# Patient Record
Sex: Female | Born: 1950 | Race: Black or African American | Hispanic: No | Marital: Married | Smoking: Former smoker
Health system: Southern US, Community
[De-identification: ages and names within clinical notes are randomized; demographics above are authoritative.]

## PROBLEM LIST (undated history)

## (undated) DIAGNOSIS — R7303 Prediabetes: Secondary | ICD-10-CM

## (undated) DIAGNOSIS — E785 Hyperlipidemia, unspecified: Secondary | ICD-10-CM

## (undated) DIAGNOSIS — I10 Essential (primary) hypertension: Secondary | ICD-10-CM

## (undated) DIAGNOSIS — I739 Peripheral vascular disease, unspecified: Secondary | ICD-10-CM

## (undated) HISTORY — DX: Essential (primary) hypertension: I10

## (undated) HISTORY — PX: EYE SURGERY: SHX253

## (undated) HISTORY — DX: Hyperlipidemia, unspecified: E78.5

---

## 2019-08-04 ENCOUNTER — Other Ambulatory Visit: Payer: Self-pay

## 2019-08-04 ENCOUNTER — Other Ambulatory Visit (HOSPITAL_COMMUNITY): Payer: Self-pay | Admitting: Family Medicine

## 2019-08-04 ENCOUNTER — Ambulatory Visit (HOSPITAL_COMMUNITY)
Admission: RE | Admit: 2019-08-04 | Discharge: 2019-08-04 | Disposition: A | Discharge status: Home / Self Care | Payer: Medicare Other | Source: Ambulatory Visit | Attending: Cardiovascular Disease | Admitting: Cardiovascular Disease

## 2019-08-04 DIAGNOSIS — I739 Peripheral vascular disease, unspecified: Secondary | ICD-10-CM | POA: Diagnosis present

## 2020-03-15 ENCOUNTER — Other Ambulatory Visit: Payer: Self-pay

## 2020-03-15 ENCOUNTER — Ambulatory Visit (INDEPENDENT_AMBULATORY_CARE_PROVIDER_SITE_OTHER): Payer: Medicare Other | Admitting: Cardiovascular Disease

## 2020-03-15 ENCOUNTER — Encounter: Payer: Self-pay | Admitting: Cardiovascular Disease

## 2020-03-15 VITALS — BP 156/78 | HR 64 | Ht 61.5 in | Wt 143.4 lb

## 2020-03-15 DIAGNOSIS — Z01818 Encounter for other preprocedural examination: Secondary | ICD-10-CM | POA: Diagnosis not present

## 2020-03-15 DIAGNOSIS — I739 Peripheral vascular disease, unspecified: Secondary | ICD-10-CM | POA: Diagnosis not present

## 2020-03-15 DIAGNOSIS — E785 Hyperlipidemia, unspecified: Secondary | ICD-10-CM | POA: Diagnosis not present

## 2020-03-15 LAB — BASIC METABOLIC PANEL
BUN/Creatinine Ratio: 16 (ref 12–28)
BUN: 12 mg/dL (ref 8–27)
CO2: 18 mmol/L — ABNORMAL LOW (ref 20–29)
Calcium: 9.5 mg/dL (ref 8.7–10.3)
Chloride: 107 mmol/L — ABNORMAL HIGH (ref 96–106)
Creatinine, Ser: 0.76 mg/dL (ref 0.57–1.00)
GFR calc Af Amer: 91 mL/min/{1.73_m2} (ref >59)
GFR calc non Af Amer: 79 mL/min/{1.73_m2} (ref >59)
Glucose: 96 mg/dL (ref 65–99)
Potassium: 4.4 mmol/L (ref 3.5–5.2)
Sodium: 140 mmol/L (ref 134–144)

## 2020-03-15 LAB — CBC
Hematocrit: 37.7 % (ref 34.0–46.6)
Hemoglobin: 13 g/dL (ref 11.1–15.9)
MCH: 31.2 pg (ref 26.6–33.0)
MCHC: 34.5 g/dL (ref 31.5–35.7)
MCV: 90 fL (ref 79–97)
Platelets: 254 10*3/uL (ref 150–450)
RBC: 4.17 x10E6/uL (ref 3.77–5.28)
RDW: 14.2 % (ref 11.7–15.4)
WBC: 11 10*3/uL — ABNORMAL HIGH (ref 3.4–10.8)

## 2020-03-15 MED ORDER — ROSUVASTATIN CALCIUM 20 MG PO TABS: 20.0000 mg | ORAL_TABLET | Freq: Every day | ORAL | 3 refills | Status: DC

## 2020-03-15 NOTE — Patient Instructions (Signed)
Medication Instructions:  START Rosuvastatin (Crestor) 20 mg once daily  *If you need a refill on your cardiac medications before your next appointment, please call your pharmacy*   Testing/Procedures: Your physician has requested that you have a peripheral vascular angiogram. This exam is performed at the hospital. During this exam IV contrast is used to look at arterial blood flow. Please review the information sheet given for details.  Follow-Up: At [facility], you and your health needs are our priority.  As part of our continuing mission to provide you with exceptional heart care, we have created designated Provider Care Teams.  These Care Teams include your primary Cardiologist (physician) and Advanced Practice Providers (APPs -  Physician Assistants and Nurse Practitioners) who all work together to provide you with the care you need, when you need it.  We recommend signing up for the patient portal called "MyChart".  Sign up information is provided on this After Visit Summary.  MyChart is used to connect with patients for Virtual Visits (Telemedicine).  Patients are able to view lab/test results, encounter notes, upcoming appointments, etc.  Non-urgent messages can be sent to your provider as well.   To learn more about what you can do with MyChart, go to [email or web address].    Your next appointment:   Keep your follow up with Dr. Kirke Corin on 04/12/20 at 8:40 am   Other Instructions    [facility] CARDIOVASCULAR DIVISION [facility] [address] [place] [address] Dept: [phone] Loc: [phone]  Christy Heath  03/15/2020  You are scheduled for a Peripheral Angiogram on Wednesday, November 17 with Dr. Lorine Bears.  1. Please arrive at the [facility] (Main Entrance A) at [facility]: [address] [place], [place] [address] at 6:30 AM (This time is two hours before your procedure to ensure your  preparation). Free valet parking service is available.   Special note: Every effort is made to have your procedure done on time. Please understand that emergencies sometimes delay scheduled procedures.  2. Diet: Do not eat solid foods after midnight.  The patient may have clear liquids until 5am upon the day of the procedure.  3. Labs: You will need to have blood drawn today: CBC and BMET  You will need to have the coronavirus test completed prior to your procedure. An appointment has been made at 12:05 pm on 03/19/20. This is a Drive Up Visit at [address] [place], [place], [place] [address]. Please tell them that you are there for procedure testing. Stay in your car and someone will be with you shortly. Please make sure to have all other labs completed before this test because you will need to stay quarantined until your procedure.   4. Medication instructions in preparation for your procedure: Hold the spironolactone the morning of the procedure   On the morning of your procedure, take your Aspirin and any morning medicines NOT listed above.  You may use sips of water.  5. Plan for one night stay--bring personal belongings. 6. Bring a current list of your medications and current insurance cards. 7. You MUST have a responsible person to drive you home. 8. Someone MUST be with you the first 24 hours after you arrive home or your discharge will be delayed. 9. Please wear clothes that are easy to get on and off and wear slip-on shoes.  Thank you for allowing [place] to care for you!   -- [facility]

## 2020-03-15 NOTE — Progress Notes (Signed)
Cardiology Office Note   Date:  03/15/2020   ID:  Christy Heath, DOB 1950/06/26, MRN [id number]  PCP:  Dois Davenport, MD  Cardiologist:   Lorine Bears, MD   No chief complaint on file.     History of Present Illness: Christy Heath is a 69 y.o. female who was referred by Dr. Hal Hope for evaluation management of peripheral arterial disease.  She has known history of hyperlipidemia, essential hypertension, COPD and previous tobacco use.  She quit smoking in March but she smoked 1 pack/day for more than 50 years.  She started having bilateral leg claudication earlier this year. She underwent lower extremity arterial Doppler in March of this year which showed moderately reduced ABI bilaterally at 0.6 on the right and 0.55 on the left.  She reports severe discomfort in both calfs worse on the left side.  This has progressed to the point where it happens 1 or 2 minutes after she starts walking and then she has to stop and rest for few minutes before she can resume.  In addition, she has bilateral buttock claudication.  She has no rest pain or lower extremity ulceration.  She tried going to the gym with her daughter but has not been able to do much aerobic activities due to severity of her symptoms.  She denies any chest pain.  She reports mild exertional dyspnea.  Past Medical History:  Diagnosis Date  . Hyperlipidemia   . Hypertension        Current Outpatient Medications  Medication Sig Dispense Refill  . aspirin EC 81 MG tablet Take 81 mg by mouth daily. Swallow whole.    . calcium-vitamin D (OSCAL WITH D) 500-200 MG-UNIT tablet Take 1 tablet by mouth. Pt takes 1 tablet once a week.    . loperamide (IMODIUM) 2 MG capsule Take 2 mg by mouth as needed for diarrhea or loose stools. Pt takes 2 tablets daily    . spironolactone (ALDACTONE) 25 MG tablet Take 25 mg by mouth daily.    Marland Kitchen tolterodine (DETROL) 2 MG tablet Take 2 mg by mouth daily.    . rosuvastatin (CRESTOR) 20 MG  tablet Take 1 tablet (20 mg total) by mouth daily. 90 tablet 3   No current facility-administered medications for this visit.    Allergies:   Patient has no known allergies.    Social History:  The patient  reports that she has quit smoking. Her smoking use included cigarettes. She has never used smokeless tobacco. She reports current alcohol use. She reports current drug use. Drug: Marijuana.   Family History:  The patient's family history is negative for peripheral arterial disease or coronary artery disease.   ROS:  Please see the history of present illness.   Otherwise, review of systems are positive for none.   All other systems are reviewed and negative.    PHYSICAL EXAM: VS:  BP (!) 156/78 (BP Location: Left Arm, Patient Position: Sitting)   Pulse 64   Ht 5' 1.5" (1.562 m)   Wt 143 lb 6.4 oz (65 kg)   SpO2 97%   BMI 26.66 kg/m  , BMI Body mass index is 26.66 kg/m. GEN: Well nourished, well developed, in no acute distress  HEENT: normal  Neck: no JVD, carotid bruits, or masses Cardiac: RRR; no  rubs, or gallops,no edema .  1 out of 6 systolic murmur in the aortic area Respiratory:  clear to auscultation bilaterally, normal work of breathing GI: soft, nontender, nondistended, +  BS MS: no deformity or atrophy  Skin: warm and dry, no rash Neuro:  Strength and sensation are intact Psych: euthymic mood, full affect Vascular: Radial pulses normal bilaterally.  Femoral pulses are +1 bilaterally but more diminished on the left side.  Distal pulses are not palpable   EKG:  EKG is ordered today. The ekg ordered today demonstrates normal sinus rhythm with possible left atrial enlargement.  No significant ST or T wave changes.   Recent Labs: No results found for requested labs within last 8760 hours.    Lipid Panel No results found for: CHOL, TRIG, HDL, CHOLHDL, VLDL, LDLCALC, LDLDIRECT    Wt Readings from Last 3 Encounters:  03/15/20 143 lb 6.4 oz (65 kg)        No  flowsheet data found.    ASSESSMENT AND PLAN:  1.  Peripheral arterial disease with severe bilateral leg claudication worse on the left side.  The patient has mostly calf claudication but also complains of buttock claudication.  Her exam is suggestive of both iliac and SFA/popliteal disease.  I discussed with her the natural history and management of peripheral arterial disease.  I discussed with her the option of starting a walking exercise program.  However, she reports severe limitations and she has not been able to advance her activities in spite of going to the gym.  Given severity of her symptoms, I recommend proceeding with abdominal aortogram with lower extremity runoff and possible endovascular intervention.  I discussed the procedure in details as well as risk and benefits. Planned access is via the right common femoral artery.  Continue aspirin 81 mg once daily.  2.  Hyperlipidemia: I started rosuvastatin 20 mg daily given diagnosis of peripheral arterial disease.  3.  Previous tobacco use: She quit smoking in March.    Disposition:   FU with me in 1 month  Signed,  Damascus Feldpausch, MD  03/15/2020 1:30 PM    [facility] 

## 2020-03-15 NOTE — H&P (View-Only) (Signed)
Cardiology Office Note   Date:  03/15/2020   ID:  Christy Heath, DOB 1950/06/26, MRN [id number]  PCP:  Dois Davenport, MD  Cardiologist:   Lorine Bears, MD   No chief complaint on file.     History of Present Illness: Christy Heath is a 69 y.o. female who was referred by Dr. Hal Hope for evaluation management of peripheral arterial disease.  She has known history of hyperlipidemia, essential hypertension, COPD and previous tobacco use.  She quit smoking in March but she smoked 1 pack/day for more than 50 years.  She started having bilateral leg claudication earlier this year. She underwent lower extremity arterial Doppler in March of this year which showed moderately reduced ABI bilaterally at 0.6 on the right and 0.55 on the left.  She reports severe discomfort in both calfs worse on the left side.  This has progressed to the point where it happens 1 or 2 minutes after she starts walking and then she has to stop and rest for few minutes before she can resume.  In addition, she has bilateral buttock claudication.  She has no rest pain or lower extremity ulceration.  She tried going to the gym with her daughter but has not been able to do much aerobic activities due to severity of her symptoms.  She denies any chest pain.  She reports mild exertional dyspnea.  Past Medical History:  Diagnosis Date  . Hyperlipidemia   . Hypertension        Current Outpatient Medications  Medication Sig Dispense Refill  . aspirin EC 81 MG tablet Take 81 mg by mouth daily. Swallow whole.    . calcium-vitamin D (OSCAL WITH D) 500-200 MG-UNIT tablet Take 1 tablet by mouth. Pt takes 1 tablet once a week.    . loperamide (IMODIUM) 2 MG capsule Take 2 mg by mouth as needed for diarrhea or loose stools. Pt takes 2 tablets daily    . spironolactone (ALDACTONE) 25 MG tablet Take 25 mg by mouth daily.    Marland Kitchen tolterodine (DETROL) 2 MG tablet Take 2 mg by mouth daily.    . rosuvastatin (CRESTOR) 20 MG  tablet Take 1 tablet (20 mg total) by mouth daily. 90 tablet 3   No current facility-administered medications for this visit.    Allergies:   Patient has no known allergies.    Social History:  The patient  reports that she has quit smoking. Her smoking use included cigarettes. She has never used smokeless tobacco. She reports current alcohol use. She reports current drug use. Drug: Marijuana.   Family History:  The patient's family history is negative for peripheral arterial disease or coronary artery disease.   ROS:  Please see the history of present illness.   Otherwise, review of systems are positive for none.   All other systems are reviewed and negative.    PHYSICAL EXAM: VS:  BP (!) 156/78 (BP Location: Left Arm, Patient Position: Sitting)   Pulse 64   Ht 5' 1.5" (1.562 m)   Wt 143 lb 6.4 oz (65 kg)   SpO2 97%   BMI 26.66 kg/m  , BMI Body mass index is 26.66 kg/m. GEN: Well nourished, well developed, in no acute distress  HEENT: normal  Neck: no JVD, carotid bruits, or masses Cardiac: RRR; no  rubs, or gallops,no edema .  1 out of 6 systolic murmur in the aortic area Respiratory:  clear to auscultation bilaterally, normal work of breathing GI: soft, nontender, nondistended, +  BS MS: no deformity or atrophy  Skin: warm and dry, no rash Neuro:  Strength and sensation are intact Psych: euthymic mood, full affect Vascular: Radial pulses normal bilaterally.  Femoral pulses are +1 bilaterally but more diminished on the left side.  Distal pulses are not palpable   EKG:  EKG is ordered today. The ekg ordered today demonstrates normal sinus rhythm with possible left atrial enlargement.  No significant ST or T wave changes.   Recent Labs: No results found for requested labs within last 8760 hours.    Lipid Panel No results found for: CHOL, TRIG, HDL, CHOLHDL, VLDL, LDLCALC, LDLDIRECT    Wt Readings from Last 3 Encounters:  03/15/20 143 lb 6.4 oz (65 kg)        No  flowsheet data found.    ASSESSMENT AND PLAN:  1.  Peripheral arterial disease with severe bilateral leg claudication worse on the left side.  The patient has mostly calf claudication but also complains of buttock claudication.  Her exam is suggestive of both iliac and SFA/popliteal disease.  I discussed with her the natural history and management of peripheral arterial disease.  I discussed with her the option of starting a walking exercise program.  However, she reports severe limitations and she has not been able to advance her activities in spite of going to the gym.  Given severity of her symptoms, I recommend proceeding with abdominal aortogram with lower extremity runoff and possible endovascular intervention.  I discussed the procedure in details as well as risk and benefits. Planned access is via the right common femoral artery.  Continue aspirin 81 mg once daily.  2.  Hyperlipidemia: I started rosuvastatin 20 mg daily given diagnosis of peripheral arterial disease.  3.  Previous tobacco use: She quit smoking in March.    Disposition:   FU with me in 1 month  Signed,  Lorine Bears, MD  03/15/2020 1:30 PM    [facility]

## 2020-03-19 ENCOUNTER — Other Ambulatory Visit (HOSPITAL_COMMUNITY)
Admission: RE | Admit: 2020-03-19 | Discharge: 2020-03-19 | Disposition: A | Discharge status: Home / Self Care | Payer: Medicare Other | Source: Ambulatory Visit | Attending: Cardiovascular Disease | Admitting: Cardiovascular Disease

## 2020-03-19 DIAGNOSIS — Z01812 Encounter for preprocedural laboratory examination: Secondary | ICD-10-CM | POA: Diagnosis present

## 2020-03-19 DIAGNOSIS — Z20822 Contact with and (suspected) exposure to covid-19: Secondary | ICD-10-CM | POA: Insufficient documentation

## 2020-03-19 LAB — SARS CORONAVIRUS 2 (TAT 6-24 HRS): SARS Coronavirus 2: NEGATIVE

## 2020-03-22 ENCOUNTER — Telehealth: Payer: Self-pay | Admitting: *Deleted

## 2020-03-22 NOTE — Telephone Encounter (Signed)
Pt contacted pre-catheterization scheduled at [facility] for: Wednesday March 23, 2020 8:30 AM Verified arrival time and place: [facility] Main Entrance A [facility]) at: 6:30 AM   No solid food after midnight prior to cath, clear liquids until 5 AM day of procedure.  Hold: Spironolactone -AM of procedure   Except hold medications AM meds can be  taken pre-cath with sips of water including: ASA 81 mg   Confirmed patient has responsible adult to drive home post procedure and be with patient first 24 hours after arriving home: yes  You are allowed ONE visitor in the waiting room during the time you are at the hospital for your procedure. Both you and your visitor must wear a mask once you enter the hospital.       COVID-19 Pre-Screening Questions:  . In the past 14 days have you had a new cough, new headache, new nasal congestion, fever (100.4 or greater) unexplained body aches, new sore throat, or sudden loss of taste or sense of smell? no . In the past 14 days have you been around anyone with known Covid 19? no    Reviewed procedure/mask/visitor instructions, COVID-19 questions with patient.

## 2020-03-23 ENCOUNTER — Encounter (HOSPITAL_COMMUNITY): Payer: Self-pay | Admitting: Cardiovascular Disease

## 2020-03-23 ENCOUNTER — Encounter (HOSPITAL_COMMUNITY)
Admission: RE | Disposition: A | Discharge status: Home / Self Care | Payer: Self-pay | Source: Home / Self Care | Attending: Cardiovascular Disease

## 2020-03-23 ENCOUNTER — Ambulatory Visit (HOSPITAL_COMMUNITY)
Admission: RE | Admit: 2020-03-23 | Discharge: 2020-03-23 | Disposition: A | Discharge status: Home / Self Care | Payer: Medicare Other | Attending: Cardiovascular Disease | Admitting: Cardiovascular Disease

## 2020-03-23 DIAGNOSIS — Z7982 Long term (current) use of aspirin: Secondary | ICD-10-CM | POA: Diagnosis not present

## 2020-03-23 DIAGNOSIS — I70212 Atherosclerosis of native arteries of extremities with intermittent claudication, left leg: Secondary | ICD-10-CM

## 2020-03-23 DIAGNOSIS — E785 Hyperlipidemia, unspecified: Secondary | ICD-10-CM | POA: Insufficient documentation

## 2020-03-23 DIAGNOSIS — I10 Essential (primary) hypertension: Secondary | ICD-10-CM | POA: Insufficient documentation

## 2020-03-23 DIAGNOSIS — J449 Chronic obstructive pulmonary disease, unspecified: Secondary | ICD-10-CM | POA: Diagnosis not present

## 2020-03-23 DIAGNOSIS — Z87891 Personal history of nicotine dependence: Secondary | ICD-10-CM | POA: Insufficient documentation

## 2020-03-23 DIAGNOSIS — Z79899 Other long term (current) drug therapy: Secondary | ICD-10-CM | POA: Insufficient documentation

## 2020-03-23 DIAGNOSIS — I739 Peripheral vascular disease, unspecified: Secondary | ICD-10-CM

## 2020-03-23 DIAGNOSIS — I714 Abdominal aortic aneurysm, without rupture: Secondary | ICD-10-CM | POA: Insufficient documentation

## 2020-03-23 DIAGNOSIS — I70213 Atherosclerosis of native arteries of extremities with intermittent claudication, bilateral legs: Secondary | ICD-10-CM | POA: Diagnosis not present

## 2020-03-23 HISTORY — PX: ABDOMINAL AORTOGRAM W/LOWER EXTREMITY: CATH118223

## 2020-03-23 HISTORY — PX: PERIPHERAL VASCULAR BALLOON ANGIOPLASTY: CATH118281

## 2020-03-23 HISTORY — PX: PERIPHERAL VASCULAR ATHERECTOMY: CATH118256

## 2020-03-23 LAB — POCT ACTIVATED CLOTTING TIME: Activated Clotting Time: 257 seconds

## 2020-03-23 SURGERY — ABDOMINAL AORTOGRAM W/LOWER EXTREMITY
Anesthesia: LOCAL

## 2020-03-23 MED ORDER — IODIXANOL 320 MG/ML IV SOLN
INTRAVENOUS | Status: DC | PRN
  Administered 2020-03-23: 195 mL

## 2020-03-23 MED ORDER — SODIUM CHLORIDE 0.9% FLUSH: 3.0000 mL | Freq: Two times a day (BID) | INTRAVENOUS | Status: DC

## 2020-03-23 MED ORDER — LIDOCAINE HCL (PF) 1 % IJ SOLN
INTRAMUSCULAR | Status: DC | PRN
  Administered 2020-03-23: 30 mL

## 2020-03-23 MED ORDER — VIPERSLIDE LUBRICANT OPTIME: TOPICAL | Status: DC | PRN

## 2020-03-23 MED ORDER — FENTANYL CITRATE (PF) 100 MCG/2ML IJ SOLN
INTRAMUSCULAR | Status: DC | PRN
  Administered 2020-03-23: 25 ug via INTRAVENOUS
  Administered 2020-03-23: 50 ug via INTRAVENOUS
  Administered 2020-03-23: 25 ug via INTRAVENOUS

## 2020-03-23 MED ORDER — NITROGLYCERIN IN D5W 200-5 MCG/ML-% IV SOLN
INTRAVENOUS | Status: AC
  Filled 2020-03-23: qty 250

## 2020-03-23 MED ORDER — ONDANSETRON HCL 4 MG/2ML IJ SOLN: 4.0000 mg | Freq: Four times a day (QID) | INTRAMUSCULAR | Status: DC | PRN

## 2020-03-23 MED ORDER — MIDAZOLAM HCL 2 MG/2ML IJ SOLN
INTRAMUSCULAR | Status: AC
  Filled 2020-03-23: qty 2

## 2020-03-23 MED ORDER — HEPARIN (PORCINE) IN NACL 1000-0.9 UT/500ML-% IV SOLN
INTRAVENOUS | Status: AC
  Filled 2020-03-23: qty 1000

## 2020-03-23 MED ORDER — ASPIRIN 81 MG PO CHEW: 81.0000 mg | CHEWABLE_TABLET | ORAL | Status: DC

## 2020-03-23 MED ORDER — CLOPIDOGREL BISULFATE 75 MG PO TABS: 75.0000 mg | ORAL_TABLET | Freq: Every day | ORAL | 6 refills | Status: DC

## 2020-03-23 MED ORDER — SODIUM CHLORIDE 0.9 % WEIGHT BASED INFUSION: 1.0000 mL/kg/h | INTRAVENOUS | Status: DC

## 2020-03-23 MED ORDER — ACETAMINOPHEN 325 MG PO TABS: 650.0000 mg | ORAL_TABLET | ORAL | Status: DC | PRN

## 2020-03-23 MED ORDER — LIDOCAINE HCL (PF) 1 % IJ SOLN
INTRAMUSCULAR | Status: AC
  Filled 2020-03-23: qty 30

## 2020-03-23 MED ORDER — CLOPIDOGREL BISULFATE 300 MG PO TABS
ORAL_TABLET | ORAL | Status: DC | PRN
  Administered 2020-03-23: 300 mg via ORAL

## 2020-03-23 MED ORDER — VERAPAMIL HCL 2.5 MG/ML IV SOLN
INTRAVENOUS | Status: AC
  Filled 2020-03-23: qty 2

## 2020-03-23 MED ORDER — SODIUM CHLORIDE 0.9% FLUSH: 3.0000 mL | INTRAVENOUS | Status: DC | PRN

## 2020-03-23 MED ORDER — HEPARIN (PORCINE) IN NACL 1000-0.9 UT/500ML-% IV SOLN
INTRAVENOUS | Status: DC | PRN
  Administered 2020-03-23: 500 mL

## 2020-03-23 MED ORDER — SODIUM CHLORIDE 0.9 % IV SOLN: 250.0000 mL | INTRAVENOUS | Status: DC | PRN

## 2020-03-23 MED ORDER — SODIUM CHLORIDE 0.9 % WEIGHT BASED INFUSION
3.0000 mL/kg/h | INTRAVENOUS | Status: AC
  Administered 2020-03-23: 3 mL/kg/h via INTRAVENOUS

## 2020-03-23 MED ORDER — HEPARIN SODIUM (PORCINE) 1000 UNIT/ML IJ SOLN
INTRAMUSCULAR | Status: DC | PRN
  Administered 2020-03-23: 7000 [IU] via INTRAVENOUS
  Administered 2020-03-23: 2000 [IU] via INTRAVENOUS

## 2020-03-23 MED ORDER — MIDAZOLAM HCL 2 MG/2ML IJ SOLN
INTRAMUSCULAR | Status: DC | PRN
  Administered 2020-03-23 (×3): 1 mg via INTRAVENOUS

## 2020-03-23 MED ORDER — SODIUM CHLORIDE 0.9 % IV SOLN: INTRAVENOUS | Status: DC

## 2020-03-23 MED ORDER — FENTANYL CITRATE (PF) 100 MCG/2ML IJ SOLN
INTRAMUSCULAR | Status: AC
  Filled 2020-03-23: qty 2

## 2020-03-23 MED ORDER — CLOPIDOGREL BISULFATE 300 MG PO TABS
ORAL_TABLET | ORAL | Status: AC
  Filled 2020-03-23: qty 1

## 2020-03-23 MED ORDER — HEPARIN SODIUM (PORCINE) 1000 UNIT/ML IJ SOLN
INTRAMUSCULAR | Status: AC
  Filled 2020-03-23: qty 1

## 2020-03-23 SURGICAL SUPPLY — 36 items
BALLN JADE .018 4.0 X 40 (BALLOONS) ×3
BALLN LUTONIX AV 8X40X75 (BALLOONS) ×3
BALLN MUSTANG 6.0X20 75 (BALLOONS) ×3
BALLOON JADE .018 4.0 X 40 (BALLOONS) ×2 IMPLANT
BALLOON LUTONIX AV 8X40X75 (BALLOONS) ×2 IMPLANT
BALLOON MUSTANG 6.0X20 75 (BALLOONS) ×2 IMPLANT
CATH ANGIO 5F PIGTAIL 65CM (CATHETERS) ×3 IMPLANT
CATH CROSS OVER TEMPO 5F (CATHETERS) ×3 IMPLANT
CATH CXI 2.6F 90 ANG (CATHETERS) ×1
CATH SPRT ANG 90X2.3FR ACPT (CATHETERS) ×2 IMPLANT
DCB RANGER 4.0X40 135 (BALLOONS) ×2 IMPLANT
DCB RANGER 4.0X60 135 (BALLOONS) ×2 IMPLANT
DEVICE CLOSURE MYNXGRIP 6/7F (Vascular Products) ×3 IMPLANT
DIAMONDBACK SOLID OAS 1.5MM (CATHETERS) ×3
KIT ENCORE 26 ADVANTAGE (KITS) ×3 IMPLANT
KIT MICROPUNCTURE NIT STIFF (SHEATH) ×3 IMPLANT
KIT PV (KITS) ×3 IMPLANT
LUBRICANT VIPERSLIDE CORONARY (MISCELLANEOUS) ×3 IMPLANT
RANGER DCB 4.0X40 135 (BALLOONS) ×3
RANGER DCB 4.0X60 135 (BALLOONS) ×3
SHEATH PINNACLE 5F 10CM (SHEATH) ×3 IMPLANT
SHEATH PINNACLE 6F 10CM (SHEATH) ×3 IMPLANT
SHEATH PINNACLE ST 6F 45CM (SHEATH) ×3 IMPLANT
SHEATH PROBE COVER 6X72 (BAG) ×3 IMPLANT
STOPCOCK MORSE 400PSI 3WAY (MISCELLANEOUS) ×3 IMPLANT
SYR MEDRAD MARK 7 150ML (SYRINGE) ×3 IMPLANT
SYSTEM DIMNDBCK SLD OAS 1.5MM (CATHETERS) ×2 IMPLANT
TAPE VIPERTRACK RADIOPAQ (MISCELLANEOUS) ×2 IMPLANT
TAPE VIPERTRACK RADIOPAQUE (MISCELLANEOUS) ×1
TRANSDUCER W/STOPCOCK (MISCELLANEOUS) ×3 IMPLANT
TRAY PV CATH (CUSTOM PROCEDURE TRAY) ×3 IMPLANT
TUBING CIL FLEX 10 FLL-RA (TUBING) ×3 IMPLANT
WIRE BENTSON .035X145CM (WIRE) ×3 IMPLANT
WIRE ROSEN-J .035X180CM (WIRE) ×3 IMPLANT
WIRE RUNTHROUGH .014X180CM (WIRE) ×3 IMPLANT
WIRE VIPER WIRECTO 0.014 (WIRE) ×6 IMPLANT

## 2020-03-23 NOTE — Progress Notes (Signed)
Christy Heath in to assess pt. And informed her of Bp readings. No new orders. States she can be d/c if no changes. Pt daughter called and informed.Ambulated pt in hallway tol well no bleeding noted before or after ambulation.

## 2020-03-23 NOTE — Discharge Instructions (Signed)
Femoral Site Care This sheet gives you information about how to care for yourself after your procedure. Your health care provider may also give you more specific instructions. If you have problems or questions, contact your health care provider. What can I expect after the procedure? After the procedure, it is common to have:  Bruising that usually fades within 1-2 weeks.  Tenderness at the site. Follow these instructions at home: Wound care  Follow instructions from your health care provider about how to take care of your insertion site. Make sure you: ? Wash your hands with soap and water before you change your bandage (dressing). If soap and water are not available, use hand sanitizer. ? Change your dressing as told by your health care provider. ? Leave stitches (sutures), skin glue, or adhesive strips in place. These skin closures may need to stay in place for 2 weeks or longer. If adhesive strip edges start to loosen and curl up, you may trim the loose edges. Do not remove adhesive strips completely unless your health care provider tells you to do that.  Do not take baths, swim, or use a hot tub until your health care provider approves.  You may shower 24-48 hours after the procedure or as told by your health care provider. ? Gently wash the site with plain soap and water. ? Pat the area dry with a clean towel. ? Do not rub the site. This may cause bleeding.  Do not apply powder or lotion to the site. Keep the site clean and dry.  Check your femoral site every day for signs of infection. Check for: ? Redness, swelling, or pain. ? Fluid or blood. ? Warmth. ? Pus or a bad smell. Activity  For the first 2-3 days after your procedure, or as long as directed: ? Avoid climbing stairs as much as possible. ? Do not squat.  Do not lift anything that is heavier than 10 lb (4.5 kg), or the limit that you are told, until your health care provider says that it is safe.  Rest as  directed. ? Avoid sitting for a long time without moving. Get up to take short walks every 1-2 hours.  Do not drive for 24 hours if you were given a medicine to help you relax (sedative). General instructions  Take over-the-counter and prescription medicines only as told by your health care provider.  Keep all follow-up visits as told by your health care provider. This is important. Contact a health care provider if you have:  A fever or chills.  You have redness, swelling, or pain around your insertion site. Get help right away if:  The catheter insertion area swells very fast.  You pass out.  You suddenly start to sweat or your skin gets clammy.  The catheter insertion area is bleeding, and the bleeding does not stop when you hold steady pressure on the area.  The area near or just beyond the catheter insertion site becomes pale, cool, tingly, or numb. These symptoms may represent a serious problem that is an emergency. Do not wait to see if the symptoms will go away. Get medical help right away. Call your local emergency services (911 in the [place] Do not drive yourself to the hospital. Summary  After the procedure, it is common to have bruising that usually fades within 1-2 weeks.  Check your femoral site every day for signs of infection.  Do not lift anything that is heavier than 10 lb (4.5 kg), or the   limit that you are told, until your health care provider says that it is safe. This information is not intended to replace advice given to you by your health care provider. Make sure you discuss any questions you have with your health care provider. Document Revised: 05/06/2017 Document Reviewed: 05/06/2017 Elsevier Patient Education  2020 Elsevier Inc.  

## 2020-03-23 NOTE — Progress Notes (Addendum)
Pt c/o pain to right buttocks  Lillia Abed, NP called and informed. States she will come by and check her

## 2020-03-23 NOTE — Interval H&P Note (Signed)
History and Physical Interval Note:  03/23/2020 8:26 AM  Christy Heath  has presented today for surgery, with the diagnosis of pad.  The various methods of treatment have been discussed with the patient and family. After consideration of risks, benefits and other options for treatment, the patient has consented to  Procedure(s): ABDOMINAL AORTOGRAM W/LOWER EXTREMITY (N/A) as a surgical intervention.  The patient's history has been reviewed, patient examined, no change in status, stable for surgery.  I have reviewed the patient's chart and labs.  Questions were answered to the patient's satisfaction.     Lorine Bears

## 2020-03-23 NOTE — Progress Notes (Signed)
Discharge instructions with pt and her daughter (via telephone) both voice understanding.  

## 2020-03-23 NOTE — Progress Notes (Signed)
Right leg is mottled in color. During report was told Dr Aware of skin color.

## 2020-03-24 ENCOUNTER — Other Ambulatory Visit: Payer: Self-pay | Admitting: *Deleted

## 2020-03-24 DIAGNOSIS — I739 Peripheral vascular disease, unspecified: Secondary | ICD-10-CM

## 2020-03-25 ENCOUNTER — Other Ambulatory Visit (HOSPITAL_COMMUNITY): Payer: Self-pay | Admitting: Cardiovascular Disease

## 2020-03-25 DIAGNOSIS — I739 Peripheral vascular disease, unspecified: Secondary | ICD-10-CM

## 2020-04-07 ENCOUNTER — Telehealth: Payer: Self-pay | Admitting: Licensed Clinical Social Worker

## 2020-04-07 ENCOUNTER — Other Ambulatory Visit (HOSPITAL_COMMUNITY): Payer: Self-pay | Admitting: Cardiovascular Disease

## 2020-04-07 ENCOUNTER — Ambulatory Visit (HOSPITAL_COMMUNITY)
Admission: RE | Admit: 2020-04-07 | Discharge: 2020-04-07 | Disposition: A | Discharge status: Home / Self Care | Payer: Medicare Other | Source: Ambulatory Visit | Attending: Cardiovascular Disease | Admitting: Cardiovascular Disease

## 2020-04-07 ENCOUNTER — Other Ambulatory Visit: Payer: Self-pay

## 2020-04-07 DIAGNOSIS — I739 Peripheral vascular disease, unspecified: Secondary | ICD-10-CM | POA: Insufficient documentation

## 2020-04-07 DIAGNOSIS — Z48812 Encounter for surgical aftercare following surgery on the circulatory system: Secondary | ICD-10-CM

## 2020-04-07 DIAGNOSIS — I714 Abdominal aortic aneurysm, without rupture, unspecified: Secondary | ICD-10-CM

## 2020-04-07 NOTE — Telephone Encounter (Signed)
CSW received consult for transportation support for pt to get to her upcoming appointments. CSW left HIPAA compliant message at [phone].   Will f/u again as able.   Octavio Graves, MSW, LCSW [facility]  [phone]

## 2020-04-08 ENCOUNTER — Telehealth: Payer: Self-pay | Admitting: Licensed Clinical Social Worker

## 2020-04-08 NOTE — Telephone Encounter (Addendum)
CSW called again this morning and was able to speak w/ pt via telephone. Introduced self, role, reason for call. Pt states she hasn't been missing appointments but feels the pressure being placed on her daughter to arrange her work/take off to take her to appointments is causing stress for her and her daughter.   CSW explained that [facility] offers transportation services. Pt agreeable to being referred. She has an upcoming appt on 12/7. Confirmed home address, contact information and PCP. She used to utilize a cane for long distances but since a recent "procedure w/ my legs" she has not had to use it as much.   CSW will send referral to [facility]. Pt aware she will get a call to enroll her in services. CSW will f/u on Monday to see if pt has been enrolled and ensure she has a ride to apt on Tuesday 12/7.   Pt also inquired about how to apply for her daughter to get paid to be her caregiver. CSW provided [facility] DSS and Dept of Public Health numbers to inquire about CAP service application. Explained that there likely is a waitlist at this time but if eligible it may be worth it for them to complete an application at this time.   Octavio Graves, MSW, LCSW [facility]  [phone]

## 2020-04-11 ENCOUNTER — Telehealth: Payer: Self-pay | Admitting: Family Medicine

## 2020-04-11 ENCOUNTER — Telehealth: Payer: Self-pay | Admitting: Licensed Clinical Social Worker

## 2020-04-11 NOTE — Telephone Encounter (Signed)
   Caitlyn Buchanan DOB: [birth year] MRN: [id number]   RIDER WAIVER AND RELEASE OF LIABILITY  For purposes of improving physical access to our facilities, [place] is pleased to partner with third parties to provide [facility] patients or other authorized individuals the option of convenient, on-demand ground transportation services (the [employer]") through use of the technology service that enables users to request on-demand ground transportation from independent third-party providers.  By opting to use and accept these [employer], I, the undersigned, hereby agree on behalf of myself, and on behalf of any minor child using the [employer] for whom I am the parent or legal guardian, as follows:  1. Science writer provided to me are provided by independent third-party transportation providers who are not [employer] or employees and who are unaffiliated with [employer]. 2. [place] is neither a transportation carrier nor a common or public carrier. 3. [place] [facility] has no control over the quality or safety of the transportation that occurs as a result of the [employer]. 4. [facility] cannot guarantee that any third-party transportation provider will complete any arranged transportation service. 5. [place] [facility] makes no representation, warranty, or guarantee regarding the reliability, timeliness, quality, safety, suitability, or availability of any of the Transport Services or that they will be error free. 6. I fully understand that traveling by vehicle involves risks and dangers of serious bodily injury, including permanent disability, paralysis, and death. I agree, on behalf of myself and on behalf of any minor child using the Transport Services for whom I am the parent or legal guardian, that the entire risk arising out of my use of the [employer] remains solely with me, to the maximum extent permitted under applicable law. 7. The [employer] are provided "as is" and "as available." [employer] disclaims all representations and warranties, express, implied or statutory, not expressly set out in these terms, including the implied warranties of merchantability and fitness for a particular purpose. 8. I hereby waive and release [place], its agents, employees, officers, directors, representatives, insurers, attorneys, assigns, successors, subsidiaries, and affiliates from any and all past, present, or future claims, demands, liabilities, actions, causes of action, or suits of any kind directly or indirectly arising from acceptance and use of the [employer]. 9. I further waive and release [place] and its affiliates from all present and future liability and responsibility for any injury or death to persons or damages to property caused by or related to the use of the [employer]. 10. I have read this Waiver and Release of Liability, and I understand the terms used in it and their legal significance. This Waiver is freely and voluntarily given with the understanding that my right (as well as the right of any minor child for whom I am the parent or legal guardian using the [employer]) to legal recourse against [place] in connection with the [employer] is knowingly surrendered in return for use of these services.   I attest that I read the consent document to Atha Starks, gave Ms. Bess the opportunity to ask questions and answered the questions asked (if any). I affirm that Tiaria Biby then provided consent for she's participation in this program.     Farley Ly

## 2020-04-11 NOTE — Telephone Encounter (Addendum)
10:12am- Confirmed pt enrolled in transportation services. Called her at [phone] and confirmed that she has a ride to her appt tomorrow and she knows that in the future should she have any additional challenges that she can call Transportation Services directly or this Clinical research associate. CSW f/u with Alecia, CVT, and let her know arrangements confirmed for her appt tomorrow.   8:45am- CSW f/u with Transportation Services to see if pt had been enrolled in program at this time.   Octavio Graves, MSW, LCSW [facility] Navigation  [phone]

## 2020-04-12 ENCOUNTER — Other Ambulatory Visit: Payer: Self-pay

## 2020-04-12 ENCOUNTER — Encounter: Payer: Self-pay | Admitting: Cardiovascular Disease

## 2020-04-12 ENCOUNTER — Ambulatory Visit (INDEPENDENT_AMBULATORY_CARE_PROVIDER_SITE_OTHER): Payer: Medicare Other | Admitting: Cardiovascular Disease

## 2020-04-12 VITALS — BP 168/82 | HR 59 | Ht 61.5 in | Wt 140.6 lb

## 2020-04-12 DIAGNOSIS — E785 Hyperlipidemia, unspecified: Secondary | ICD-10-CM

## 2020-04-12 DIAGNOSIS — I714 Abdominal aortic aneurysm, without rupture, unspecified: Secondary | ICD-10-CM

## 2020-04-12 DIAGNOSIS — I739 Peripheral vascular disease, unspecified: Secondary | ICD-10-CM | POA: Diagnosis not present

## 2020-04-12 NOTE — Progress Notes (Signed)
Cardiology Office Note   Date:  04/12/2020   ID:  Christy Heath, DOB 1950/09/02, MRN [id number]  PCP:  Dois Davenport, MD  Cardiologist:   Lorine Bears, MD   No chief complaint on file.     History of Present Illness: Christy Heath is a 69 y.o. female who is here today for follow-up visit regarding peripheral arterial disease.  She has known history of hyperlipidemia, essential hypertension, COPD and previous tobacco use.  She quit smoking in March but she smoked 1 pack/day for more than 50 years.   She was seen recently for severe bilateral leg claudication worse on the left side.  She underwent lower extremity arterial Doppler in March of this year which showed moderately reduced ABI bilaterally at 0.6 on the right and 0.55 on the left. I proceeded with angiogram last month which showed small infrarenal abdominal aortic aneurysm. on the right side, there was mild iliac disease, borderline significant common femoral artery stenosis, severe proximal SFA stenosis with medium length occlusion in the distal SFA with reconstitution via collaterals in the proximal segment of the popliteal artery with three-vessel runoff below the knee.  On the left side, there was severe stenosis in the common iliac artery, severe proximal SFA disease, severe calcified proximal popliteal artery stenosis and three-vessel runoff below the knee.  I performed successful drug-coated balloon angioplasty to the left common iliac artery as well as orbital atherectomy and drug-coated balloon angioplasty to the left SFA and popliteal artery.  Postprocedure vascular studies showed an ABI of 0.66 on the right and 0.81 on the left.  Left common iliac artery was patent with normal velocities.  Aortic aneurysm measured 3 cm. She reports complete resolution of left leg claudication.  She does have residual right leg claudication but she has been able to walk for 1 mile.  Typically, she gets some right discomfort after half of  a mile but she is able to walk through that.  No chest pain or shortness of breath.  She is taking her medications regularly.   Past Medical History:  Diagnosis Date  . Hyperlipidemia   . Hypertension        Current Outpatient Medications  Medication Sig Dispense Refill  . aspirin EC 81 MG tablet Take 81 mg by mouth daily. Swallow whole.    . Cholecalciferol (VITAMIN D3) 250 MCG (10000 UT) TABS Take 10,000 Units by mouth every Saturday.    . clopidogrel (PLAVIX) 75 MG tablet Take 1 tablet (75 mg total) by mouth daily. 30 tablet 6  . loperamide (IMODIUM) 2 MG capsule Take 2 mg by mouth as needed for diarrhea or loose stools.    . Multiple Vitamins-Minerals (EYE SUPPORT PO) Take 1 tablet by mouth daily. Eyesight Max    . rosuvastatin (CRESTOR) 20 MG tablet Take 1 tablet (20 mg total) by mouth daily. 90 tablet 3  . spironolactone (ALDACTONE) 25 MG tablet Take 25 mg by mouth daily.    Marland Kitchen tolterodine (DETROL LA) 2 MG 24 hr capsule Take 2 mg by mouth daily.     No current facility-administered medications for this visit.    Allergies:   Patient has no known allergies.    Social History:  The patient  reports that she has quit smoking. Her smoking use included cigarettes. She has never used smokeless tobacco. She reports current alcohol use. She reports current drug use. Drug: Marijuana.   Family History:  The patient's family history is negative for peripheral arterial  disease or coronary artery disease.   ROS:  Please see the history of present illness.   Otherwise, review of systems are positive for none.   All other systems are reviewed and negative.    PHYSICAL EXAM: VS:  BP (!) 168/82   Pulse (!) 59   Ht 5' 1.5" (1.562 m)   Wt 140 lb 9.6 oz (63.8 kg)   SpO2 99%   BMI 26.14 kg/m  , BMI Body mass index is 26.14 kg/m. GEN: Well nourished, well developed, in no acute distress  HEENT: normal  Neck: no JVD, carotid bruits, or masses Cardiac: RRR; no  rubs, or gallops,no edema  .  1 out of 6 systolic murmur in the aortic area Respiratory:  clear to auscultation bilaterally, normal work of breathing GI: soft, nontender, nondistended, + BS MS: no deformity or atrophy  Skin: warm and dry, no rash Neuro:  Strength and sensation are intact Psych: euthymic mood, full affect Vascular: Radial pulses normal bilaterally.  Femoral pulse: +1 on the right and +2 on the left.  No groin hematoma.     EKG:  EKG is not ordered today.    Recent Labs: 03/15/2020: BUN 12; Creatinine, Ser 0.76; Hemoglobin 13.0; Platelets 254; Potassium 4.4; Sodium 140    Lipid Panel No results found for: CHOL, TRIG, HDL, CHOLHDL, VLDL, LDLCALC, LDLDIRECT    Wt Readings from Last 3 Encounters:  04/12/20 140 lb 9.6 oz (63.8 kg)  03/23/20 145 lb (65.8 kg)  03/15/20 143 lb 6.4 oz (65 kg)        No flowsheet data found.    ASSESSMENT AND PLAN:  1.  Peripheral arterial disease with severe bilateral leg claudication worse on the left side.  Status post recent revascularization of the left lower extremity with complete resolution of claudication.  She continues to have mild to moderate right leg claudication due to common femoral artery disease and occluded distal SFA.  Symptoms do not seem to be lifestyle limiting at this point.  Recommend continuing medical therapy and a walking exercise program.  If symptoms worsen upon follow-up, we can consider revascularization of the right lower extremity.  2.  Hyperlipidemia: I started rosuvastatin 20 mg daily.  She will require a follow-up lipid and liver profile.  3.  Previous tobacco use: She quit smoking in March.  4.  Elevated blood pressure without history of hypertension: Her blood pressure has been elevated twice during the 2 office visits with [place].  I discussed with her the indications to start the medication but she prefers to start with lifestyle changes.  I provided her with low sodium diet instructions and I discussed the importance of  regular exercise.  If blood pressure remains elevated, we could consider adding an ARB or a calcium channel blocker.  5.  Abdominal aortic aneurysm: 3 cm in diameter.  This will be monitored on a yearly basis.    Disposition:   FU with me in 3 months  Signed,  Lorine Bears, MD  04/12/2020 9:13 AM    [facility]

## 2020-04-12 NOTE — Patient Instructions (Signed)
Medication Instructions:  No changes *If you need a refill on your cardiac medications before your next appointment, please call your pharmacy*   Lab Work: None ordered If you have labs (blood work) drawn today and your tests are completely normal, you will receive your results only by: Marland Kitchen MyChart Message (if you have MyChart) OR . A paper copy in the mail If you have any lab test that is abnormal or we need to change your treatment, we will call you to review the results.   Testing/Procedures: None ordered   Follow-Up: At [facility], you and your health needs are our priority.  As part of our continuing mission to provide you with exceptional heart care, we have created designated Provider Care Teams.  These Care Teams include your primary Cardiologist (physician) and Advanced Practice Providers (APPs -  Physician Assistants and Nurse Practitioners) who all work together to provide you with the care you need, when you need it.  We recommend signing up for the patient portal called "MyChart".  Sign up information is provided on this After Visit Summary.  MyChart is used to connect with patients for Virtual Visits (Telemedicine).  Patients are able to view lab/test results, encounter notes, upcoming appointments, etc.  Non-urgent messages can be sent to your provider as well.   To learn more about what you can do with MyChart, go to [email or web address].    Your next appointment:   3 month(s)  The format for your next appointment:   In Person  Provider:   Dr. Kirke Corin   Other Instructions  Low-Sodium Eating Plan Sodium, which is an element that makes up salt, helps you maintain a healthy balance of fluids in your body. Too much sodium can increase your blood pressure and cause fluid and waste to be held in your body. Your health care provider or dietitian may recommend following this plan if you have high blood pressure (hypertension), kidney disease, liver disease, or heart  failure. Eating less sodium can help lower your blood pressure, reduce swelling, and protect your heart, liver, and kidneys. What are tips for following this plan? General guidelines  Most people on this plan should limit their sodium intake to 1,500-2,000 mg (milligrams) of sodium each day. Reading food labels   The Nutrition Facts label lists the amount of sodium in one serving of the food. If you eat more than one serving, you must multiply the listed amount of sodium by the number of servings.  Choose foods with less than 140 mg of sodium per serving.  Avoid foods with 300 mg of sodium or more per serving. Shopping  Look for lower-sodium products, often labeled as "low-sodium" or "no salt added."  Always check the sodium content even if foods are labeled as "unsalted" or "no salt added".  Buy fresh foods. ? Avoid canned foods and premade or frozen meals. ? Avoid canned, cured, or processed meats  Buy breads that have less than 80 mg of sodium per slice. Cooking  Eat more home-cooked food and less restaurant, buffet, and fast food.  Avoid adding salt when cooking. Use salt-free seasonings or herbs instead of table salt or sea salt. Check with your health care provider or pharmacist before using salt substitutes.  Cook with plant-based oils, such as canola, sunflower, or olive oil. Meal planning  When eating at a restaurant, ask that your food be prepared with less salt or no salt, if possible.  Avoid foods that contain MSG (monosodium glutamate). MSG is sometimes  added to [place] food, bouillon, and some canned foods. What foods are recommended? The items listed may not be a complete list. Talk with your dietitian about what dietary choices are best for you. Grains Low-sodium cereals, including oats, puffed wheat and rice, and shredded wheat. Low-sodium crackers. Unsalted rice. Unsalted pasta. Low-sodium bread. Whole-grain breads and whole-grain pasta. Vegetables Fresh or  frozen vegetables. "No salt added" canned vegetables. "No salt added" tomato sauce and paste. Low-sodium or reduced-sodium tomato and vegetable juice. Fruits Fresh, frozen, or canned fruit. Fruit juice. Meats and other protein foods Fresh or frozen (no salt added) meat, poultry, seafood, and fish. Low-sodium canned tuna and salmon. Unsalted nuts. Dried peas, beans, and lentils without added salt. Unsalted canned beans. Eggs. Unsalted nut butters. Dairy Milk. Soy milk. Cheese that is naturally low in sodium, such as ricotta cheese, fresh mozzarella, or Swiss cheese Low-sodium or reduced-sodium cheese. Cream cheese. Yogurt. Fats and oils Unsalted butter. Unsalted margarine with no trans fat. Vegetable oils such as canola or olive oils. Seasonings and other foods Fresh and dried herbs and spices. Salt-free seasonings. Low-sodium mustard and ketchup. Sodium-free salad dressing. Sodium-free light mayonnaise. Fresh or refrigerated horseradish. Lemon juice. Vinegar. Homemade, reduced-sodium, or low-sodium soups. Unsalted popcorn and pretzels. Low-salt or salt-free chips. What foods are not recommended? The items listed may not be a complete list. Talk with your dietitian about what dietary choices are best for you. Grains Instant hot cereals. Bread stuffing, pancake, and biscuit mixes. Croutons. Seasoned rice or pasta mixes. Noodle soup cups. Boxed or frozen macaroni and cheese. Regular salted crackers. Self-rising flour. Vegetables Sauerkraut, pickled vegetables, and relishes. Olives. [place] fries. Onion rings. Regular canned vegetables (not low-sodium or reduced-sodium). Regular canned tomato sauce and paste (not low-sodium or reduced-sodium). Regular tomato and vegetable juice (not low-sodium or reduced-sodium). Frozen vegetables in sauces. Meats and other protein foods Meat or fish that is salted, canned, smoked, spiced, or pickled. Bacon, ham, sausage, hotdogs, corned beef, chipped beef, packaged  lunch meats, salt pork, jerky, pickled herring, anchovies, regular canned tuna, sardines, salted nuts. Dairy Processed cheese and cheese spreads. Cheese curds. Blue cheese. Feta cheese. String cheese. Regular cottage cheese. Buttermilk. Canned milk. Fats and oils Salted butter. Regular margarine. Ghee. Bacon fat. Seasonings and other foods Onion salt, garlic salt, seasoned salt, table salt, and sea salt. Canned and packaged gravies. Worcestershire sauce. Tartar sauce. Barbecue sauce. Teriyaki sauce. Soy sauce, including reduced-sodium. Steak sauce. Fish sauce. Oyster sauce. Cocktail sauce. Horseradish that you find on the shelf. Regular ketchup and mustard. Meat flavorings and tenderizers. Bouillon cubes. Hot sauce and Tabasco sauce. Premade or packaged marinades. Premade or packaged taco seasonings. Relishes. Regular salad dressings. Salsa. Potato and tortilla chips. Corn chips and puffs. Salted popcorn and pretzels. Canned or dried soups. Pizza. Frozen entrees and pot pies. Summary  Eating less sodium can help lower your blood pressure, reduce swelling, and protect your heart, liver, and kidneys.  Most people on this plan should limit their sodium intake to 1,500-2,000 mg (milligrams) of sodium each day.  Canned, boxed, and frozen foods are high in sodium. Restaurant foods, fast foods, and pizza are also very high in sodium. You also get sodium by adding salt to food.  Try to cook at home, eat more fresh fruits and vegetables, and eat less fast food, canned, processed, or prepared foods. This information is not intended to replace advice given to you by your health care provider. Make sure you discuss any questions you have with your  health care provider. Document Revised: 04/05/2017 Document Reviewed: 04/16/2016 Elsevier Patient Education  2020 [employer].

## 2020-07-12 ENCOUNTER — Ambulatory Visit: Payer: Medicare Other | Admitting: Cardiovascular Disease

## 2020-07-19 ENCOUNTER — Ambulatory Visit (INDEPENDENT_AMBULATORY_CARE_PROVIDER_SITE_OTHER): Payer: Medicare Other | Admitting: Cardiovascular Disease

## 2020-07-19 ENCOUNTER — Encounter: Payer: Self-pay | Admitting: Cardiovascular Disease

## 2020-07-19 ENCOUNTER — Other Ambulatory Visit: Payer: Self-pay

## 2020-07-19 VITALS — BP 162/84 | HR 71 | Ht 61.5 in | Wt 134.0 lb

## 2020-07-19 DIAGNOSIS — I714 Abdominal aortic aneurysm, without rupture, unspecified: Secondary | ICD-10-CM

## 2020-07-19 DIAGNOSIS — E785 Hyperlipidemia, unspecified: Secondary | ICD-10-CM | POA: Diagnosis not present

## 2020-07-19 DIAGNOSIS — I10 Essential (primary) hypertension: Secondary | ICD-10-CM

## 2020-07-19 DIAGNOSIS — I739 Peripheral vascular disease, unspecified: Secondary | ICD-10-CM | POA: Diagnosis not present

## 2020-07-19 MED ORDER — AMLODIPINE BESYLATE 5 MG PO TABS: 5.0000 mg | ORAL_TABLET | Freq: Every day | ORAL | 3 refills | Status: DC

## 2020-07-19 NOTE — Patient Instructions (Signed)
Medication Instructions:  START Amlodipine 5 mg once daily  *If you need a refill on your cardiac medications before your next appointment, please call your pharmacy*   Lab Work: None ordered If you have labs (blood work) drawn today and your tests are completely normal, you will receive your results only by: Marland Kitchen MyChart Message (if you have MyChart) OR . A paper copy in the mail If you have any lab test that is abnormal or we need to change your treatment, we will call you to review the results.   Testing/Procedures: Your physician has requested that you have an Aorta/Iliac Duplex in June 2022. This will be take place at [address] [facility], [address].    No food after 11PM the night before.  Water is OK. (Don't drink liquids if you have been instructed not to for ANOTHER test).  Take two Extra-Strength Gas-X capsules at bedtime the night before test.   Take an additional two Extra-Strength Gas-X capsules three (3) hours before the test or first thing in the morning.    Avoid foods that produce bowel gas, for 24 hours prior to exam (see below).    No breakfast, no chewing gum, no smoking or carbonated beverages.  Patient may take morning medications with water.  Come in for test at least 15 minutes early to register.  Your physician has requested that you have an ankle brachial index (ABI) in June 2022. During this test an ultrasound and blood pressure cuff are used to evaluate the arteries that supply the arms and legs with blood. Allow thirty minutes for this exam. There are no restrictions or special instructions. This will take place at [address] [facility], [address].   Your physician has requested that you have a lower extremity arterial duplex in June 2022. During this test, ultrasound is used to evaluate arterial blood flow in the legs. Allow one hour for this exam. There are no restrictions or special instructions. This will take place at [address] [facility], [address].    Follow-Up: At [facility], you and your health needs are our priority.  As part of our continuing mission to provide you with exceptional heart care, we have created designated Provider Care Teams.  These Care Teams include your primary Cardiologist (physician) and Advanced Practice Providers (APPs -  Physician Assistants and Nurse Practitioners) who all work together to provide you with the care you need, when you need it.  We recommend signing up for the patient portal called "MyChart".  Sign up information is provided on this After Visit Summary.  MyChart is used to connect with patients for Virtual Visits (Telemedicine).  Patients are able to view lab/test results, encounter notes, upcoming appointments, etc.  Non-urgent messages can be sent to your provider as well.   To learn more about what you can do with MyChart, go to [email or web address].    Your next appointment:   6 month(s)  The format for your next appointment:   In Person  Provider:   Lorine Bears, MD

## 2020-07-19 NOTE — Progress Notes (Signed)
Cardiology Office Note   Date:  07/19/2020   ID:  Christy Heath, DOB 1950-07-14, MRN [id number]  PCP:  Christy Davenport, MD  Cardiologist:   Lorine Bears, MD   No chief complaint on file.     History of Present Illness: Christy Heath is a 70 y.o. female who is here today for follow-up visit regarding peripheral arterial disease.  She has known history of hyperlipidemia, essential hypertension, COPD and previous tobacco use.  She quit smoking in 2021 but she smoked 1 pack/day for more than 50 years.   She was seen in late 2021 for severe bilateral leg claudication worse on the left side.  Angiography was done in November which showed small infrarenal abdominal aortic aneurysm. on the right side, there was mild iliac disease, borderline significant common femoral artery stenosis, severe proximal SFA stenosis with medium length occlusion in the distal SFA with reconstitution via collaterals in the proximal segment of the popliteal artery with three-vessel runoff below the knee.  On the left side, there was severe stenosis in the common iliac artery, severe proximal SFA disease, severe calcified proximal popliteal artery stenosis and three-vessel runoff below the knee.  I performed successful drug-coated balloon angioplasty to the left common iliac artery as well as orbital atherectomy and drug-coated balloon angioplasty to the left SFA and popliteal artery.  Postprocedure vascular studies showed an ABI of 0.66 on the right and 0.81 on the left.  Left common iliac artery was patent with normal velocities.  Aortic aneurysm measured 3 cm.  She has been doing reasonably well and denies any leg claudication.  She is not bothered by left arm discomfort and is concerned that it might be related to circulation.  Left arm pain happens in certain positions only.  The blood pressure in the left arm is higher than the right arm.   Past Medical History:  Diagnosis Date  . Hyperlipidemia   .  Hypertension        Current Outpatient Medications  Medication Sig Dispense Refill  . amLODipine (NORVASC) 5 MG tablet Take 1 tablet (5 mg total) by mouth daily. 90 tablet 3  . aspirin EC 81 MG tablet Take 81 mg by mouth daily. Swallow whole.    . Cholecalciferol (VITAMIN D3) 250 MCG (10000 UT) TABS Take 10,000 Units by mouth every Saturday.    . clopidogrel (PLAVIX) 75 MG tablet Take 1 tablet (75 mg total) by mouth daily. 30 tablet 6  . loperamide (IMODIUM) 2 MG capsule Take 2 mg by mouth as needed for diarrhea or loose stools.    . Multiple Vitamins-Minerals (EYE SUPPORT PO) Take 1 tablet by mouth daily. Eyesight Max    . spironolactone (ALDACTONE) 25 MG tablet Take 25 mg by mouth daily.    . rosuvastatin (CRESTOR) 20 MG tablet Take 1 tablet (20 mg total) by mouth daily. 90 tablet 3  . tolterodine (DETROL LA) 2 MG 24 hr capsule Take 2 mg by mouth daily.     No current facility-administered medications for this visit.    Allergies:   Patient has no known allergies.    Social History:  The patient  reports that she has quit smoking. Her smoking use included cigarettes. She has never used smokeless tobacco. She reports current alcohol use. She reports current drug use. Drug: Marijuana.   Family History:  The patient's family history is negative for peripheral arterial disease or coronary artery disease.   ROS:  Please see the history of  present illness.   Otherwise, review of systems are positive for none.   All other systems are reviewed and negative.    PHYSICAL EXAM: VS:  BP (!) 162/84 (BP Location: Right Arm, Patient Position: Sitting)   Pulse 71   Ht 5' 1.5" (1.562 m)   Wt 134 lb (60.8 kg)   SpO2 99%   BMI 24.91 kg/m  , BMI Body mass index is 24.91 kg/m. GEN: Well nourished, well developed, in no acute distress  HEENT: normal  Neck: no JVD, carotid bruits, or masses Cardiac: RRR; no  rubs, or gallops,no edema .  1 /6 systolic murmur in the aortic area Respiratory:   clear to auscultation bilaterally, normal work of breathing GI: soft, nontender, nondistended, + BS MS: no deformity or atrophy  Skin: warm and dry, no rash Neuro:  Strength and sensation are intact Psych: euthymic mood, full affect Vascular: Radial pulses normal bilaterally.  Femoral pulse: +1 on the right and +2 on the left.    EKG:  EKG is not ordered today.    Recent Labs: 03/15/2020: BUN 12; Creatinine, Ser 0.76; Hemoglobin 13.0; Platelets 254; Potassium 4.4; Sodium 140    Lipid Panel No results found for: CHOL, TRIG, HDL, CHOLHDL, VLDL, LDLCALC, LDLDIRECT    Wt Readings from Last 3 Encounters:  07/19/20 134 lb (60.8 kg)  04/12/20 140 lb 9.6 oz (63.8 kg)  03/23/20 145 lb (65.8 kg)        No flowsheet data found.    ASSESSMENT AND PLAN:  1.  Peripheral arterial disease .  Status post revascularization of the left lower extremity with complete resolution of claudication.  She does have residual disease affecting the right lower extremity including moderate common femoral artery disease and occluded distal SFA.  Symptoms do not seem to be lifestyle limiting at this point.  Continue medical therapy. Repeat Doppler studies in June of this year.  2.  Hyperlipidemia: I started rosuvastatin 20 mg daily.  She will require a follow-up lipid and liver profile with next visit.  3.  Previous tobacco use: She quit smoking in March.  4.  Essential hypertension: Blood pressure continues to be elevated on multiple occasions.  I elected to add amlodipine 5 mg once daily.  5.  Abdominal aortic aneurysm: 3 cm in diameter.  This will be monitored on a yearly basis.    Disposition:   FU with me in 6 months  Signed,  Lorine Bears, MD  07/19/2020 10:57 AM    [facility]

## 2020-10-18 ENCOUNTER — Ambulatory Visit (HOSPITAL_COMMUNITY)
Admission: RE | Admit: 2020-10-18 | Payer: Medicare Other | Source: Ambulatory Visit | Attending: Cardiovascular Disease | Admitting: Cardiovascular Disease

## 2020-10-18 ENCOUNTER — Other Ambulatory Visit: Payer: Self-pay

## 2020-10-18 ENCOUNTER — Ambulatory Visit (HOSPITAL_COMMUNITY)
Admission: RE | Admit: 2020-10-18 | Discharge: 2020-10-18 | Disposition: A | Discharge status: Home / Self Care | Payer: Medicare Other | Source: Ambulatory Visit | Attending: Cardiovascular Disease | Admitting: Cardiovascular Disease

## 2020-10-18 DIAGNOSIS — I739 Peripheral vascular disease, unspecified: Secondary | ICD-10-CM | POA: Diagnosis not present

## 2020-10-20 ENCOUNTER — Other Ambulatory Visit (HOSPITAL_COMMUNITY): Payer: Self-pay | Admitting: Cardiovascular Disease

## 2020-10-20 ENCOUNTER — Telehealth: Payer: Self-pay | Admitting: Cardiovascular Disease

## 2020-10-20 DIAGNOSIS — G540 Brachial plexus disorders: Secondary | ICD-10-CM

## 2020-10-20 NOTE — Telephone Encounter (Signed)
Left message for patient to call and discuss scheduling the Upper extremity doppler ordered by Dr. Kirke Corin

## 2020-10-21 ENCOUNTER — Telehealth: Payer: Self-pay | Admitting: Cardiovascular Disease

## 2020-10-21 NOTE — Telephone Encounter (Signed)
Spoke with patient to discuss scheduling the Upper extremity  doppler study ordered by Dr. Derwood Kaplan patient we could not include this study with her 11/15/20 appointment ---we rescheduled the Aorta/Iliac and added the upper extremity doppler----11/23/20 at 9:00 am---will mail information to patient and she voiced her understanding.

## 2020-10-27 ENCOUNTER — Emergency Department (HOSPITAL_COMMUNITY): Payer: Medicare Other

## 2020-10-27 ENCOUNTER — Other Ambulatory Visit: Payer: Self-pay

## 2020-10-27 ENCOUNTER — Emergency Department (HOSPITAL_COMMUNITY)
Admission: EM | Admit: 2020-10-27 | Discharge: 2020-10-27 | Disposition: A | Discharge status: Home / Self Care | Payer: Medicare Other | Attending: Emergency Medicine | Admitting: Emergency Medicine

## 2020-10-27 ENCOUNTER — Encounter (HOSPITAL_COMMUNITY): Payer: Self-pay | Admitting: Emergency Medicine

## 2020-10-27 DIAGNOSIS — R112 Nausea with vomiting, unspecified: Secondary | ICD-10-CM | POA: Insufficient documentation

## 2020-10-27 DIAGNOSIS — R630 Anorexia: Secondary | ICD-10-CM | POA: Diagnosis not present

## 2020-10-27 DIAGNOSIS — Z87891 Personal history of nicotine dependence: Secondary | ICD-10-CM | POA: Insufficient documentation

## 2020-10-27 DIAGNOSIS — Z7982 Long term (current) use of aspirin: Secondary | ICD-10-CM | POA: Diagnosis not present

## 2020-10-27 DIAGNOSIS — Z79899 Other long term (current) drug therapy: Secondary | ICD-10-CM | POA: Insufficient documentation

## 2020-10-27 DIAGNOSIS — I10 Essential (primary) hypertension: Secondary | ICD-10-CM | POA: Diagnosis not present

## 2020-10-27 DIAGNOSIS — E860 Dehydration: Secondary | ICD-10-CM | POA: Diagnosis not present

## 2020-10-27 DIAGNOSIS — U071 COVID-19: Secondary | ICD-10-CM | POA: Insufficient documentation

## 2020-10-27 DIAGNOSIS — Z28310 Unvaccinated for covid-19: Secondary | ICD-10-CM | POA: Insufficient documentation

## 2020-10-27 LAB — CBC WITH DIFFERENTIAL/PLATELET
Abs Immature Granulocytes: 0.04 10*3/uL (ref 0.00–0.07)
Basophils Absolute: 0 10*3/uL (ref 0.0–0.1)
Basophils Relative: 0 %
Eosinophils Absolute: 0 10*3/uL (ref 0.0–0.5)
Eosinophils Relative: 0 %
HCT: 46.4 % — ABNORMAL HIGH (ref 36.0–46.0)
Hemoglobin: 16 g/dL — ABNORMAL HIGH (ref 12.0–15.0)
Immature Granulocytes: 0 %
Lymphocytes Relative: 21 %
Lymphs Abs: 2.1 10*3/uL (ref 0.7–4.0)
MCH: 30.7 pg (ref 26.0–34.0)
MCHC: 34.5 g/dL (ref 30.0–36.0)
MCV: 88.9 fL (ref 80.0–100.0)
Monocytes Absolute: 1.1 10*3/uL — ABNORMAL HIGH (ref 0.1–1.0)
Monocytes Relative: 11 %
Neutro Abs: 7 10*3/uL (ref 1.7–7.7)
Neutrophils Relative %: 68 %
Platelets: 205 10*3/uL (ref 150–400)
RBC: 5.22 MIL/uL — ABNORMAL HIGH (ref 3.87–5.11)
RDW: 14.5 % (ref 11.5–15.5)
WBC: 10.3 10*3/uL (ref 4.0–10.5)
nRBC: 0 % (ref 0.0–0.2)

## 2020-10-27 LAB — COMPREHENSIVE METABOLIC PANEL
ALT: 22 U/L (ref 0–44)
AST: 25 U/L (ref 15–41)
Albumin: 5.1 g/dL — ABNORMAL HIGH (ref 3.5–5.0)
Alkaline Phosphatase: 108 U/L (ref 38–126)
Anion gap: 20 — ABNORMAL HIGH (ref 5–15)
BUN: 32 mg/dL — ABNORMAL HIGH (ref 8–23)
CO2: 16 mmol/L — ABNORMAL LOW (ref 22–32)
Calcium: 9.9 mg/dL (ref 8.9–10.3)
Chloride: 97 mmol/L — ABNORMAL LOW (ref 98–111)
Creatinine, Ser: 0.98 mg/dL (ref 0.44–1.00)
GFR, Estimated: >60 mL/min (ref >60)
Glucose, Bld: 137 mg/dL — ABNORMAL HIGH (ref 70–99)
Potassium: 3.9 mmol/L (ref 3.5–5.1)
Sodium: 133 mmol/L — ABNORMAL LOW (ref 135–145)
Total Bilirubin: 1.3 mg/dL — ABNORMAL HIGH (ref 0.3–1.2)
Total Protein: 9 g/dL — ABNORMAL HIGH (ref 6.5–8.1)

## 2020-10-27 LAB — BLOOD GAS, VENOUS
Acid-base deficit: 6.4 mmol/L — ABNORMAL HIGH (ref 0.0–2.0)
Bicarbonate: 16.3 mmol/L — ABNORMAL LOW (ref 20.0–28.0)
O2 Saturation: 95.2 %
Patient temperature: 98.6
pCO2, Ven: 26.3 mmHg — ABNORMAL LOW (ref 44.0–60.0)
pH, Ven: 7.409 (ref 7.250–7.430)
pO2, Ven: 83.9 mmHg — ABNORMAL HIGH (ref 32.0–45.0)

## 2020-10-27 MED ORDER — ONDANSETRON 8 MG PO TBDP: 8.0000 mg | ORAL_TABLET | Freq: Three times a day (TID) | ORAL | 0 refills | Status: DC | PRN

## 2020-10-27 MED ORDER — ONDANSETRON HCL 4 MG/2ML IJ SOLN
4.0000 mg | Freq: Once | INTRAMUSCULAR | Status: AC
Start: 2020-10-27 — End: 2020-10-27
  Administered 2020-10-27: 4 mg via INTRAVENOUS
  Filled 2020-10-27: qty 2

## 2020-10-27 MED ORDER — SODIUM CHLORIDE 0.9 % IV BOLUS
1000.0000 mL | Freq: Once | INTRAVENOUS | Status: AC
Start: 2020-10-27 — End: 2020-10-27
  Administered 2020-10-27: 1000 mL via INTRAVENOUS

## 2020-10-27 MED ORDER — SODIUM CHLORIDE 0.9 % IV BOLUS
1000.0000 mL | Freq: Once | INTRAVENOUS | Status: AC
  Administered 2020-10-27: 1000 mL via INTRAVENOUS

## 2020-10-27 NOTE — ED Notes (Signed)
Pt passed po challenge

## 2020-10-27 NOTE — ED Triage Notes (Signed)
BIBA Per EMS:  Pt coming from home with complaints of being covid positive since Saturday  diarrhea is pt only compliant  Hx COPD

## 2020-10-27 NOTE — ED Provider Notes (Signed)
[facility]-EMERGENCY DEPT Provider Note   CSN: [id number] Arrival date & time: 10/27/20  6160     History No chief complaint on file.   Christy Heath is a 70 y.o. female.  Christy Heath to the ED 6 days into her COVID illness.  She is unvaccinated.  She has received PACs lipid.  She has been suffering from weakness, anorexia, nausea, and myalgias.  She has some dyspnea when she walks.  No fever.  The history is provided by the patient.  Weakness Severity:  Moderate Onset quality:  Gradual Duration:  6 days Timing:  Constant Progression:  Unchanged Chronicity:  New Context comment:  COVID positive Relieved by:  Nothing Worsened by:  Nothing Ineffective treatments: Paxlovid. Associated symptoms: anorexia, diarrhea, headaches, lethargy, myalgias, nausea, shortness of breath and vomiting   Associated symptoms: no abdominal pain, no arthralgias, no chest pain, no cough, no dysuria, no fever and no seizures       Past Medical History:  Diagnosis Date   Hyperlipidemia    Hypertension     There are no problems to display for this patient.   Past Surgical History:  Procedure Laterality Date   ABDOMINAL AORTOGRAM W/LOWER EXTREMITY N/A 03/23/2020   Procedure: ABDOMINAL AORTOGRAM W/LOWER EXTREMITY;  Surgeon: Iran Ouch, MD;  Location: [facility] INVASIVE CV LAB;  Service: Cardiovascular;  Laterality: N/A;   PERIPHERAL VASCULAR ATHERECTOMY Left 03/23/2020   Procedure: PERIPHERAL VASCULAR ATHERECTOMY;  Surgeon: Iran Ouch, MD;  Location: [facility] INVASIVE CV LAB;  Service: Cardiovascular;  Laterality: Left;   PERIPHERAL VASCULAR BALLOON ANGIOPLASTY Left 03/23/2020   Procedure: PERIPHERAL VASCULAR BALLOON ANGIOPLASTY;  Surgeon: Iran Ouch, MD;  Location: [facility] INVASIVE CV LAB;  Service: Cardiovascular;  Laterality: Left;     OB History   No obstetric history on file.     History reviewed. No pertinent family history.  Social History    Tobacco Use   Smoking status: Former    Pack years: 0.00    Types: Cigarettes   Smokeless tobacco: Never  Substance Use Topics   Alcohol use: Yes    Comment: Occasional   Drug use: Yes    Types: Marijuana    Home Medications Prior to Admission medications   Medication Sig Start Date End Date Taking? Authorizing Provider  amLODipine (NORVASC) 5 MG tablet Take 1 tablet (5 mg total) by mouth daily. 07/19/20 10/17/20  Iran Ouch, MD  aspirin EC 81 MG tablet Take 81 mg by mouth daily. Swallow whole.    [provider]  Cholecalciferol (VITAMIN D3) 250 MCG (10000 UT) TABS Take 10,000 Units by mouth every Saturday.    [provider]  clopidogrel (PLAVIX) 75 MG tablet Take 1 tablet (75 mg total) by mouth daily. 03/23/20 03/23/21  Iran Ouch, MD  loperamide (IMODIUM) 2 MG capsule Take 2 mg by mouth as needed for diarrhea or loose stools.    [provider]  Multiple Vitamins-Minerals (EYE SUPPORT PO) Take 1 tablet by mouth daily. Eyesight Max    [provider]  rosuvastatin (CRESTOR) 20 MG tablet Take 1 tablet (20 mg total) by mouth daily. 03/15/20 06/13/20  Iran Ouch, MD  spironolactone (ALDACTONE) 25 MG tablet Take 25 mg by mouth daily.    [provider]  tolterodine (DETROL LA) 2 MG 24 hr capsule Take 2 mg by mouth daily. 02/09/20   [provider]    Allergies    Patient has no known allergies.  Review of Systems   Review of Systems  Constitutional:  Negative for chills and fever.  HENT:  Negative for ear pain and sore throat.   Eyes:  Negative for pain and visual disturbance.  Respiratory:  Positive for shortness of breath. Negative for cough.   Cardiovascular:  Negative for chest pain and palpitations.  Gastrointestinal:  Positive for anorexia, diarrhea, nausea and vomiting. Negative for abdominal pain.  Genitourinary:  Negative for dysuria and hematuria.  Musculoskeletal:  Positive for myalgias. Negative  for arthralgias and back pain.  Skin:  Negative for color change and rash.  Neurological:  Positive for weakness and headaches. Negative for seizures and syncope.  All other systems reviewed and are negative.  Physical Exam Updated Vital Signs BP (!) 150/100 (BP Location: Right Arm)   Pulse (!) 101   Temp 97.8 F (36.6 C) (Oral)   Resp 13   SpO2 99%   Physical Exam Vitals and nursing note reviewed.  Constitutional:      General: She is not in acute distress.    Appearance: She is well-developed.  HENT:     Head: Normocephalic and atraumatic.  Eyes:     Conjunctiva/sclera: Conjunctivae normal.  Cardiovascular:     Rate and Rhythm: Normal rate and regular rhythm.     Heart sounds: No murmur heard. Pulmonary:     Effort: Pulmonary effort is normal. No respiratory distress.     Breath sounds: Normal breath sounds.  Abdominal:     Palpations: Abdomen is soft.     Tenderness: There is no abdominal tenderness.  Musculoskeletal:        General: No deformity.     Cervical back: Neck supple.  Skin:    General: Skin is warm and dry.  Neurological:     General: No focal deficit present.     Mental Status: She is alert and oriented to person, place, and time.    ED Results / Procedures / Treatments   Labs (all labs ordered are listed, but only abnormal results are displayed) Labs Reviewed  CBC WITH DIFFERENTIAL/PLATELET - Abnormal; Notable for the following components:      Result Value   RBC 5.22 (*)    Hemoglobin 16.0 (*)    HCT 46.4 (*)    Monocytes Absolute 1.1 (*)    All other components within normal limits  COMPREHENSIVE METABOLIC PANEL - Abnormal; Notable for the following components:   Sodium 133 (*)    Chloride 97 (*)    CO2 16 (*)    Glucose, Bld 137 (*)    BUN 32 (*)    Total Protein 9.0 (*)    Albumin 5.1 (*)    Total Bilirubin 1.3 (*)    Anion gap 20 (*)    All other components within normal limits  BLOOD GAS, VENOUS - Abnormal; Notable for the  following components:   pCO2, Ven 26.3 (*)    pO2, Ven 83.9 (*)    Bicarbonate 16.3 (*)    Acid-base deficit 6.4 (*)    All other components within normal limits    EKG None  Radiology No results found.  Procedures Procedures   Medications Ordered in ED Medications  sodium chloride 0.9 % bolus 1,000 mL (0 mLs Intravenous Stopped 10/27/20 1340)  ondansetron (ZOFRAN) injection 4 mg (4 mg Intravenous Given 10/27/20 1156)  sodium chloride 0.9 % bolus 1,000 mL (1,000 mLs Intravenous New Bag/Given 10/27/20 1342)    ED Course  I have reviewed the triage  vital signs and the nursing notes.  Pertinent labs & imaging results that were available during my care of the patient were reviewed by me and considered in my medical decision making (see chart for details).    MDM Rules/Calculators/A&P                          Atha Starks resents to the ED with COVID-19.  Her main complaint is lack of oral intake secondary to nausea and diarrhea.  She was found to be fairly profoundly dehydrated.  She had a low bicarb, and anion gap.  She received 2 L of IV fluids and was feeling better.  She was able to tolerate fluids by mouth.  I have offered her admission for ongoing hydration versus trial oral rehydration at home with ondansetron.  She would like to go home but does agree to follow-up if symptoms worsen. Final Clinical Impression(s) / ED Diagnoses Final diagnoses:  COVID-19  Dehydration    Rx / DC Orders ED Discharge Orders     None        Koleen Distance, MD 10/27/20 1511

## 2020-11-15 ENCOUNTER — Encounter (HOSPITAL_COMMUNITY): Payer: Medicare Other

## 2020-11-22 ENCOUNTER — Ambulatory Visit: Payer: Medicare Other | Admitting: Cardiovascular Disease

## 2020-11-23 ENCOUNTER — Other Ambulatory Visit (HOSPITAL_COMMUNITY): Payer: Self-pay | Admitting: Cardiovascular Disease

## 2020-11-23 ENCOUNTER — Ambulatory Visit (HOSPITAL_COMMUNITY)
Admission: RE | Admit: 2020-11-23 | Discharge: 2020-11-23 | Disposition: A | Discharge status: Home / Self Care | Payer: Medicare Other | Source: Ambulatory Visit | Attending: Cardiology | Admitting: Cardiology

## 2020-11-23 ENCOUNTER — Other Ambulatory Visit: Payer: Self-pay

## 2020-11-23 DIAGNOSIS — I714 Abdominal aortic aneurysm, without rupture, unspecified: Secondary | ICD-10-CM

## 2020-11-23 DIAGNOSIS — I739 Peripheral vascular disease, unspecified: Secondary | ICD-10-CM

## 2020-11-23 DIAGNOSIS — G540 Brachial plexus disorders: Secondary | ICD-10-CM | POA: Diagnosis not present

## 2020-12-08 ENCOUNTER — Other Ambulatory Visit: Payer: Self-pay | Admitting: Cardiovascular Disease

## 2020-12-27 ENCOUNTER — Ambulatory Visit (INDEPENDENT_AMBULATORY_CARE_PROVIDER_SITE_OTHER): Payer: Medicare Other | Admitting: Cardiovascular Disease

## 2020-12-27 ENCOUNTER — Other Ambulatory Visit: Payer: Self-pay

## 2020-12-27 VITALS — BP 128/78 | HR 69 | Ht 61.5 in | Wt 126.0 lb

## 2020-12-27 DIAGNOSIS — I714 Abdominal aortic aneurysm, without rupture, unspecified: Secondary | ICD-10-CM

## 2020-12-27 DIAGNOSIS — I739 Peripheral vascular disease, unspecified: Secondary | ICD-10-CM

## 2020-12-27 DIAGNOSIS — I10 Essential (primary) hypertension: Secondary | ICD-10-CM

## 2020-12-27 DIAGNOSIS — E785 Hyperlipidemia, unspecified: Secondary | ICD-10-CM

## 2020-12-27 LAB — HEPATIC FUNCTION PANEL
ALT: 20 IU/L (ref 0–32)
AST: 20 IU/L (ref 0–40)
Albumin: 5 g/dL — ABNORMAL HIGH (ref 3.7–4.7)
Alkaline Phosphatase: 120 IU/L (ref 44–121)
Bilirubin Total: 0.3 mg/dL (ref 0.0–1.2)
Bilirubin, Direct: <0.1 mg/dL (ref 0.00–0.40)
Total Protein: 7.3 g/dL (ref 6.0–8.5)

## 2020-12-27 LAB — LIPID PANEL
Chol/HDL Ratio: 2.1 ratio (ref 0.0–4.4)
Cholesterol, Total: 135 mg/dL (ref 100–199)
HDL: 63 mg/dL (ref >39)
LDL Chol Calc (NIH): 54 mg/dL (ref 0–99)
Triglycerides: 95 mg/dL (ref 0–149)
VLDL Cholesterol Cal: 18 mg/dL (ref 5–40)

## 2020-12-27 NOTE — Patient Instructions (Signed)
Medication Instructions:  STOP aspirin CONTINUE all other current medications  *If you need a refill on your cardiac medications before your next appointment, please call your pharmacy*   Lab Work: Lipid/Liver Function Panel today   If you have labs (blood work) drawn today and your tests are completely normal, you will receive your results only by: MyChart Message (if you have MyChart) OR A paper copy in the mail If you have any lab test that is abnormal or we need to change your treatment, we will call you to review the results.   Testing/Procedures: NONE   Follow-Up: At [facility], you and your health needs are our priority.  As part of our continuing mission to provide you with exceptional heart care, we have created designated Provider Care Teams.  These Care Teams include your primary Cardiologist (physician) and Advanced Practice Providers (APPs -  Physician Assistants and Nurse Practitioners) who all work together to provide you with the care you need, when you need it.  We recommend signing up for the patient portal called "MyChart".  Sign up information is provided on this After Visit Summary.  MyChart is used to connect with patients for Virtual Visits (Telemedicine).  Patients are able to view lab/test results, encounter notes, upcoming appointments, etc.  Non-urgent messages can be sent to your provider as well.   To learn more about what you can do with MyChart, go to [email or web address].    Your next appointment:   6 month(s)  The format for your next appointment:   In Person  Provider:   Lorine Bears, MD   Other Instructions

## 2020-12-27 NOTE — Progress Notes (Signed)
Cardiology Office Note   Date:  12/27/2020   ID:  Christy Heath, DOB 02-17-1951, MRN [id number]  PCP:  Dois Davenport, MD  Cardiologist:   Lorine Bears, MD   No chief complaint on file.     History of Present Illness: Christy Heath is a 70 y.o. female who is here today for follow-up visit regarding peripheral arterial disease.  She has known history of hyperlipidemia, essential hypertension, COPD and previous tobacco use.  She quit smoking in 2021 but she smoked 1 pack/day for more than 50 years.   She was seen in late 2021 for severe bilateral leg claudication worse on the left side.  Angiography was done in November which showed small infrarenal abdominal aortic aneurysm. on the right side, there was mild iliac disease, borderline significant common femoral artery stenosis, severe proximal SFA stenosis with medium length occlusion in the distal SFA with reconstitution via collaterals in the proximal segment of the popliteal artery with three-vessel runoff below the knee.  On the left side, there was severe stenosis in the common iliac artery, severe proximal SFA disease, severe calcified proximal popliteal artery stenosis and three-vessel runoff below the knee.  I performed successful drug-coated balloon angioplasty to the left common iliac artery as well as orbital atherectomy and drug-coated balloon angioplasty to the left SFA and popliteal artery.   Most recent noninvasive vascular studies done in July showed an ABI of 0.73 on the right and 0.88 on the left.  Duplex showed stable size abdominal aortic aneurysm at 3.3 cm with patent left iliac artery and patent left popliteal artery. She has been doing very well with no recent chest pain, shortness of breath or palpitations.  She exercises regularly with no leg claudication.   Past Medical History:  Diagnosis Date   Hyperlipidemia    Hypertension        Current Outpatient Medications  Medication Sig Dispense Refill    aspirin EC 81 MG tablet Take 81 mg by mouth daily. Swallow whole.     Black Currant Seed Oil 500 MG CAPS Take 1,000 mg by mouth.     Cholecalciferol (VITAMIN D3) 250 MCG (10000 UT) TABS Take 10,000 Units by mouth every Saturday.     clopidogrel (PLAVIX) 75 MG tablet Take 1 tablet by mouth once daily 30 tablet 0   loperamide (IMODIUM) 2 MG capsule Take 2 mg by mouth as needed for diarrhea or loose stools.     METHYLCELLULOSE OP Take 1,600 mg by mouth. Sea Moss dietary supplement/gel     Multiple Vitamins-Minerals (EYE SUPPORT PO) Take 1 tablet by mouth daily. Eyesight Max     ondansetron (ZOFRAN-ODT) 8 MG disintegrating tablet Take 1 tablet (8 mg total) by mouth every 8 (eight) hours as needed for nausea or vomiting. 20 tablet 0   rosuvastatin (CRESTOR) 20 MG tablet Take 1 tablet (20 mg total) by mouth daily. 90 tablet 3   spironolactone (ALDACTONE) 25 MG tablet Take 25 mg by mouth daily.     tolterodine (DETROL LA) 2 MG 24 hr capsule Take 2 mg by mouth daily.     amLODipine (NORVASC) 5 MG tablet Take 1 tablet (5 mg total) by mouth daily. 90 tablet 3   No current facility-administered medications for this visit.    Allergies:   Patient has no known allergies.    Social History:  The patient  reports that she has quit smoking. Her smoking use included cigarettes. She has never used smokeless tobacco. She  reports current alcohol use. She reports current drug use. Drug: Marijuana.   Family History:  The patient's family history is negative for peripheral arterial disease or coronary artery disease.   ROS:  Please see the history of present illness.   Otherwise, review of systems are positive for none.   All other systems are reviewed and negative.    PHYSICAL EXAM: VS:  BP 128/78 (BP Location: Left Arm, Patient Position: Sitting, Cuff Size: Normal)   Pulse 69   Ht 5' 1.5" (1.562 m)   Wt 126 lb (57.2 kg)   SpO2 95%   BMI 23.42 kg/m  , BMI Body mass index is 23.42 kg/m. GEN: Well  nourished, well developed, in no acute distress  HEENT: normal  Neck: no JVD, carotid bruits, or masses Cardiac: RRR; no  rubs, or gallops,no edema .  1 /6 systolic murmur in the aortic area Respiratory:  clear to auscultation bilaterally, normal work of breathing GI: soft, nontender, nondistended, + BS MS: no deformity or atrophy  Skin: warm and dry, no rash Neuro:  Strength and sensation are intact Psych: euthymic mood, full affect    EKG:  EKG is  ordered today. EKG showed normal sinus rhythm with no significant ST or T wave changes.   Recent Labs: 10/27/2020: ALT 22; BUN 32; Creatinine, Ser 0.98; Hemoglobin 16.0; Platelets 205; Potassium 3.9; Sodium 133    Lipid Panel No results found for: CHOL, TRIG, HDL, CHOLHDL, VLDL, LDLCALC, LDLDIRECT    Wt Readings from Last 3 Encounters:  12/27/20 126 lb (57.2 kg)  07/19/20 134 lb (60.8 kg)  04/12/20 140 lb 9.6 oz (63.8 kg)        No flowsheet data found.    ASSESSMENT AND PLAN:  1.  Peripheral arterial disease .  Status post revascularization of the left lower extremity with complete resolution of claudication.  She does have residual disease affecting the right lower extremity including moderate common femoral artery disease and occluded distal SFA.  However, she does not have claudication at the present time and thus we will continue medical therapy.  I discussed with her the importance of continued exercise program. I asked her to discontinue aspirin and continue clopidogrel.  2.  Hyperlipidemia: She is tolerating rosuvastatin 20 mg daily which was started by me.  I requested a follow-up lipid and liver profile.  3.  Previous tobacco use: No relapse.  4.  Essential hypertension: Blood pressure normalized with addition of amlodipine.  5.  Abdominal aortic aneurysm: 3.3 cm in diameter.  This will be monitored on a yearly basis.    Disposition:   FU with me in 6 months  Signed,  Lorine Bears, MD  12/27/2020 9:17  AM    [facility]

## 2021-01-10 ENCOUNTER — Encounter: Payer: Self-pay | Admitting: *Deleted

## 2021-01-17 ENCOUNTER — Ambulatory Visit: Payer: Medicare Other | Admitting: Cardiovascular Disease

## 2021-04-03 ENCOUNTER — Encounter (HOSPITAL_COMMUNITY): Payer: Self-pay

## 2021-04-03 ENCOUNTER — Emergency Department (HOSPITAL_COMMUNITY): Payer: Medicare Other

## 2021-04-03 ENCOUNTER — Inpatient Hospital Stay (HOSPITAL_COMMUNITY)
Admission: EM | Admit: 2021-04-03 | Discharge: 2021-04-06 | DRG: 872 | Disposition: A | Discharge status: Home / Self Care | Payer: Medicare Other | Attending: Internal Medicine | Admitting: Internal Medicine

## 2021-04-03 ENCOUNTER — Other Ambulatory Visit: Payer: Self-pay

## 2021-04-03 DIAGNOSIS — A419 Sepsis, unspecified organism: Secondary | ICD-10-CM

## 2021-04-03 DIAGNOSIS — Z79899 Other long term (current) drug therapy: Secondary | ICD-10-CM

## 2021-04-03 DIAGNOSIS — I959 Hypotension, unspecified: Secondary | ICD-10-CM | POA: Diagnosis present

## 2021-04-03 DIAGNOSIS — R197 Diarrhea, unspecified: Secondary | ICD-10-CM | POA: Diagnosis present

## 2021-04-03 DIAGNOSIS — R0781 Pleurodynia: Secondary | ICD-10-CM | POA: Diagnosis present

## 2021-04-03 DIAGNOSIS — I739 Peripheral vascular disease, unspecified: Secondary | ICD-10-CM | POA: Diagnosis present

## 2021-04-03 DIAGNOSIS — A4189 Other specified sepsis: Secondary | ICD-10-CM | POA: Diagnosis not present

## 2021-04-03 DIAGNOSIS — E785 Hyperlipidemia, unspecified: Secondary | ICD-10-CM | POA: Diagnosis present

## 2021-04-03 DIAGNOSIS — K529 Noninfective gastroenteritis and colitis, unspecified: Secondary | ICD-10-CM | POA: Diagnosis present

## 2021-04-03 DIAGNOSIS — Z20822 Contact with and (suspected) exposure to covid-19: Secondary | ICD-10-CM | POA: Diagnosis present

## 2021-04-03 DIAGNOSIS — J101 Influenza due to other identified influenza virus with other respiratory manifestations: Secondary | ICD-10-CM | POA: Diagnosis not present

## 2021-04-03 DIAGNOSIS — E871 Hypo-osmolality and hyponatremia: Secondary | ICD-10-CM | POA: Diagnosis present

## 2021-04-03 DIAGNOSIS — R059 Cough, unspecified: Secondary | ICD-10-CM

## 2021-04-03 DIAGNOSIS — Z9862 Peripheral vascular angioplasty status: Secondary | ICD-10-CM

## 2021-04-03 DIAGNOSIS — I10 Essential (primary) hypertension: Secondary | ICD-10-CM | POA: Diagnosis present

## 2021-04-03 DIAGNOSIS — Z7902 Long term (current) use of antithrombotics/antiplatelets: Secondary | ICD-10-CM

## 2021-04-03 DIAGNOSIS — I70202 Unspecified atherosclerosis of native arteries of extremities, left leg: Secondary | ICD-10-CM | POA: Diagnosis present

## 2021-04-03 DIAGNOSIS — N179 Acute kidney failure, unspecified: Secondary | ICD-10-CM | POA: Diagnosis present

## 2021-04-03 DIAGNOSIS — Z87891 Personal history of nicotine dependence: Secondary | ICD-10-CM

## 2021-04-03 LAB — COMPREHENSIVE METABOLIC PANEL
ALT: 14 U/L (ref 0–44)
AST: 19 U/L (ref 15–41)
Albumin: 3.4 g/dL — ABNORMAL LOW (ref 3.5–5.0)
Alkaline Phosphatase: 59 U/L (ref 38–126)
Anion gap: 9 (ref 5–15)
BUN: 20 mg/dL (ref 8–23)
CO2: 19 mmol/L — ABNORMAL LOW (ref 22–32)
Calcium: 8.1 mg/dL — ABNORMAL LOW (ref 8.9–10.3)
Chloride: 103 mmol/L (ref 98–111)
Creatinine, Ser: 1.24 mg/dL — ABNORMAL HIGH (ref 0.44–1.00)
GFR, Estimated: 46 mL/min — ABNORMAL LOW (ref >60)
Glucose, Bld: 136 mg/dL — ABNORMAL HIGH (ref 70–99)
Potassium: 4 mmol/L (ref 3.5–5.1)
Sodium: 131 mmol/L — ABNORMAL LOW (ref 135–145)
Total Bilirubin: 0.8 mg/dL (ref 0.3–1.2)
Total Protein: 6.4 g/dL — ABNORMAL LOW (ref 6.5–8.1)

## 2021-04-03 LAB — CBC WITH DIFFERENTIAL/PLATELET
Abs Immature Granulocytes: 0.06 10*3/uL (ref 0.00–0.07)
Basophils Absolute: 0 10*3/uL (ref 0.0–0.1)
Basophils Relative: 0 %
Eosinophils Absolute: 0 10*3/uL (ref 0.0–0.5)
Eosinophils Relative: 0 %
HCT: 39.7 % (ref 36.0–46.0)
Hemoglobin: 13.5 g/dL (ref 12.0–15.0)
Immature Granulocytes: 1 %
Lymphocytes Relative: 6 %
Lymphs Abs: 0.6 10*3/uL — ABNORMAL LOW (ref 0.7–4.0)
MCH: 31.3 pg (ref 26.0–34.0)
MCHC: 34 g/dL (ref 30.0–36.0)
MCV: 92.1 fL (ref 80.0–100.0)
Monocytes Absolute: 1.2 10*3/uL — ABNORMAL HIGH (ref 0.1–1.0)
Monocytes Relative: 11 %
Neutro Abs: 8.5 10*3/uL — ABNORMAL HIGH (ref 1.7–7.7)
Neutrophils Relative %: 82 %
Platelets: 190 10*3/uL (ref 150–400)
RBC: 4.31 MIL/uL (ref 3.87–5.11)
RDW: 14.4 % (ref 11.5–15.5)
WBC: 10.4 10*3/uL (ref 4.0–10.5)
nRBC: 0 % (ref 0.0–0.2)

## 2021-04-03 LAB — URINALYSIS, ROUTINE W REFLEX MICROSCOPIC
Bilirubin Urine: NEGATIVE
Glucose, UA: NEGATIVE mg/dL
Ketones, ur: NEGATIVE mg/dL
Leukocytes,Ua: NEGATIVE
Nitrite: NEGATIVE
Protein, ur: NEGATIVE mg/dL
Specific Gravity, Urine: 1.006 (ref 1.005–1.030)
pH: 5 (ref 5.0–8.0)

## 2021-04-03 LAB — RESP PANEL BY RT-PCR (FLU A&B, COVID) ARPGX2
Influenza A by PCR: POSITIVE — AB
Influenza B by PCR: NEGATIVE
SARS Coronavirus 2 by RT PCR: NEGATIVE

## 2021-04-03 LAB — PROTIME-INR
INR: 0.9 (ref 0.8–1.2)
Prothrombin Time: 12.6 seconds (ref 11.4–15.2)

## 2021-04-03 LAB — APTT: aPTT: 23 seconds — ABNORMAL LOW (ref 24–36)

## 2021-04-03 LAB — LACTIC ACID, PLASMA: Lactic Acid, Venous: 1.2 mmol/L (ref 0.5–1.9)

## 2021-04-03 MED ORDER — SODIUM CHLORIDE 0.9 % IV SOLN
2.0000 g | Freq: Two times a day (BID) | INTRAVENOUS | Status: DC
  Administered 2021-04-04 – 2021-04-06 (×5): 2 g via INTRAVENOUS
  Filled 2021-04-03 (×4): qty 2

## 2021-04-03 MED ORDER — VANCOMYCIN HCL IN DEXTROSE 1-5 GM/200ML-% IV SOLN: 1000.0000 mg | INTRAVENOUS | Status: DC

## 2021-04-03 MED ORDER — LACTATED RINGERS IV SOLN: INTRAVENOUS | Status: DC

## 2021-04-03 MED ORDER — ACETAMINOPHEN 650 MG RE SUPP: 650.0000 mg | Freq: Four times a day (QID) | RECTAL | Status: DC | PRN

## 2021-04-03 MED ORDER — SODIUM CHLORIDE 0.9 % IV SOLN
2.0000 g | Freq: Once | INTRAVENOUS | Status: AC
  Administered 2021-04-03: 20:00:00 2 g via INTRAVENOUS
  Filled 2021-04-03: qty 2

## 2021-04-03 MED ORDER — METRONIDAZOLE 500 MG/100ML IV SOLN
500.0000 mg | Freq: Two times a day (BID) | INTRAVENOUS | Status: DC
  Administered 2021-04-03 – 2021-04-05 (×5): 500 mg via INTRAVENOUS
  Filled 2021-04-03 (×2): qty 100

## 2021-04-03 MED ORDER — ENOXAPARIN SODIUM 40 MG/0.4ML IJ SOSY
40.0000 mg | PREFILLED_SYRINGE | INTRAMUSCULAR | Status: DC
  Administered 2021-04-04 – 2021-04-06 (×3): 40 mg via SUBCUTANEOUS
  Filled 2021-04-03 (×2): qty 0.4

## 2021-04-03 MED ORDER — LACTATED RINGERS IV BOLUS (SEPSIS)
1000.0000 mL | Freq: Once | INTRAVENOUS | Status: AC
  Administered 2021-04-03: 21:00:00 1000 mL via INTRAVENOUS

## 2021-04-03 MED ORDER — CLOPIDOGREL BISULFATE 75 MG PO TABS
75.0000 mg | ORAL_TABLET | Freq: Every day | ORAL | Status: DC
  Administered 2021-04-04 – 2021-04-06 (×3): 75 mg via ORAL
  Filled 2021-04-03 (×3): qty 1

## 2021-04-03 MED ORDER — VANCOMYCIN HCL IN DEXTROSE 1-5 GM/200ML-% IV SOLN
1000.0000 mg | Freq: Once | INTRAVENOUS | Status: DC
  Filled 2021-04-03: qty 200

## 2021-04-03 MED ORDER — ROSUVASTATIN CALCIUM 20 MG PO TABS
20.0000 mg | ORAL_TABLET | Freq: Every day | ORAL | Status: DC
  Administered 2021-04-04 – 2021-04-06 (×3): 20 mg via ORAL
  Filled 2021-04-03 (×3): qty 1

## 2021-04-03 MED ORDER — METRONIDAZOLE 500 MG/100ML IV SOLN
500.0000 mg | Freq: Once | INTRAVENOUS | Status: AC
  Administered 2021-04-03: 21:00:00 500 mg via INTRAVENOUS
  Filled 2021-04-03: qty 100

## 2021-04-03 MED ORDER — VANCOMYCIN HCL 1250 MG/250ML IV SOLN
1250.0000 mg | Freq: Once | INTRAVENOUS | Status: AC
  Administered 2021-04-03: 21:00:00 1250 mg via INTRAVENOUS
  Filled 2021-04-03: qty 250

## 2021-04-03 MED ORDER — METRONIDAZOLE 500 MG/100ML IV SOLN
INTRAVENOUS | Status: AC
  Filled 2021-04-03: qty 100

## 2021-04-03 MED ORDER — ACETAMINOPHEN 325 MG PO TABS
650.0000 mg | ORAL_TABLET | Freq: Four times a day (QID) | ORAL | Status: DC | PRN
  Administered 2021-04-04 – 2021-04-06 (×2): 650 mg via ORAL
  Filled 2021-04-03: qty 2

## 2021-04-03 NOTE — Progress Notes (Signed)
A consult was received from an ED physician for vancomycin & cefepime per pharmacy dosing.  The patient's profile has been reviewed for ht/wt/allergies/indication/available labs.    A one time order has been placed for vancomycin 1250 mg & cefepime 2 gm.    Further antibiotics/pharmacy consults should be ordered by admitting physician if indicated.                       Thank you,  Herby Abraham, Pharm.D [phone] 04/03/2021 8:12 PM

## 2021-04-03 NOTE — H&P (Signed)
History and Physical    Christy Heath:[id number] DOB: 11/18/1950 DOA: 04/03/2021  PCP: Dois Davenport, MD  Patient coming from: Home.  Chief Complaint: Generalized body aches diarrhea flulike symptoms  HPI: Christy Heath is a 70 y.o. female with history of peripheral vascular disease status post revascularization, hypertension, hyperlipidemia prior history of tobacco abuse presents to the ER because patient has been having flulike symptoms with upper respiratory tract symptoms headache diarrhea body aches.  Patient states she has chronic diarrhea but acutely worsened last 3 days and has been having multiple episodes of diarrhea today she also started experiencing nausea vomiting.  Denies any abdominal pain.  Has been having generalized weakness.  Patient's family members have turned out to be positive for COVID and also one of them was positive for flu.  ED Course: In the ER patient had a temperature of 100.4 F hypotensive with a blood pressure systolic in the 70s lactic acid was normal.  Patient was given sepsis protocol fluid bolus following which blood pressure improved.  Abdomen appears benign on exam.  Labs show creatinine 1.2 and bicarb of 19 sodium 131 WBC 10.4 influenza test was positive COVID test was negative.  Chest x-ray unremarkable UA unremarkable.  Patient admitted for sepsis secondary to influenza or diarrhea.  Review of Systems: As per HPI, rest all negative.   Past Medical History:  Diagnosis Date   Hyperlipidemia    Hypertension     Past Surgical History:  Procedure Laterality Date   ABDOMINAL AORTOGRAM W/LOWER EXTREMITY N/A 03/23/2020   Procedure: ABDOMINAL AORTOGRAM W/LOWER EXTREMITY;  Surgeon: Iran Ouch, MD;  Location: [facility];  Service: Cardiovascular;  Laterality: N/A;   PERIPHERAL VASCULAR ATHERECTOMY Left 03/23/2020   Procedure: PERIPHERAL VASCULAR ATHERECTOMY;  Surgeon: Iran Ouch, MD;  Location: [facility];  Service:  Cardiovascular;  Laterality: Left;   PERIPHERAL VASCULAR BALLOON ANGIOPLASTY Left 03/23/2020   Procedure: PERIPHERAL VASCULAR BALLOON ANGIOPLASTY;  Surgeon: Iran Ouch, MD;  Location: [facility];  Service: Cardiovascular;  Laterality: Left;     reports that she has quit smoking. Her smoking use included cigarettes. She has never used smokeless tobacco. She reports current alcohol use. She reports that she does not currently use drugs after having used the following drugs: Marijuana.  No Known Allergies  History reviewed. No pertinent family history.  Prior to Admission medications   Medication Sig Start Date End Date Taking? Authorizing Provider  acetaminophen (TYLENOL) 325 MG tablet Take 650 mg by mouth every 6 (six) hours as needed for fever or moderate pain.   Yes [provider]  amLODipine (NORVASC) 5 MG tablet Take 1 tablet (5 mg total) by mouth daily. 07/19/20 04/03/22 Yes Iran Ouch, MD  Black Currant Seed Oil 500 MG CAPS Take 1,000 mg by mouth daily.   Yes [provider]  Cholecalciferol (VITAMIN D3) 250 MCG (10000 UT) TABS Take 10,000 Units by mouth every Saturday.   Yes [provider]  clopidogrel (PLAVIX) 75 MG tablet Take 1 tablet by mouth once daily 12/08/20  Yes Iran Ouch, MD  loperamide (IMODIUM) 2 MG capsule Take 2 mg by mouth as needed for diarrhea or loose stools.   Yes [provider]  losartan-hydrochlorothiazide (HYZAAR) 50-12.5 MG tablet Take 1.5 tablets by mouth daily. 02/22/21  Yes [provider]  METHYLCELLULOSE OP Take 1,600 mg by mouth daily. Sea Moss dietary supplement/gel   Yes [provider]  Multiple Vitamins-Minerals (  EYE SUPPORT PO) Take 1 tablet by mouth daily. Eyesight Max   Yes [provider]  rosuvastatin (CRESTOR) 20 MG tablet Take 1 tablet (20 mg total) by mouth daily. 03/15/20 04/03/22 Yes Iran Ouch, MD  tolterodine (DETROL LA) 2 MG 24 hr capsule Take 2  mg by mouth daily. 02/09/20  Yes [provider]  ondansetron (ZOFRAN-ODT) 8 MG disintegrating tablet Take 1 tablet (8 mg total) by mouth every 8 (eight) hours as needed for nausea or vomiting. Patient not taking: Reported on 04/03/2021 10/27/20   Koleen Distance, MD    Physical Exam: Constitutional: Moderately built and nourished. Vitals:   04/03/21 2015 04/03/21 2100 04/03/21 2130 04/03/21 2300  BP: (!) 83/58 (!) 74/65 (!) 105/47 114/76  Pulse: 88 76 73 72  Resp: 14 16 11  (!) 22  Temp:      TempSrc:      SpO2: 97% 97% 98% 98%  Weight:      Height:       Eyes: Anicteric no pallor. ENMT: No discharge from the ears eyes nose and mouth. Neck: No mass felt.  No neck rigidity. Respiratory: No rhonchi or crepitations. Cardiovascular: S1-S2 heard. Abdomen: Soft nontender bowel sound present. Musculoskeletal: No edema. Skin: No rash. Neurologic: Alert awake oriented to time place and person.  Moves all extremities. Psychiatric: Appears normal.  Normal affect.   Labs on Admission: I have personally reviewed following labs and imaging studies  CBC: Recent Labs  Lab 04/03/21 2020  WBC 10.4  NEUTROABS 8.5*  HGB 13.5  HCT 39.7  MCV 92.1  PLT 190   Basic Metabolic Panel: Recent Labs  Lab 04/03/21 2212  NA 131*  K 4.0  CL 103  CO2 19*  GLUCOSE 136*  BUN 20  CREATININE 1.24*  CALCIUM 8.1*   GFR: Estimated Creatinine Clearance: 34.6 mL/min (A) (by C-G formula based on SCr of 1.24 mg/dL (H)). Liver Function Tests: Recent Labs  Lab 04/03/21 2212  AST 19  ALT 14  ALKPHOS 59  BILITOT 0.8  PROT 6.4*  ALBUMIN 3.4*   No results for input(s): LIPASE, AMYLASE in the last 168 hours. No results for input(s): AMMONIA in the last 168 hours. Coagulation Profile: Recent Labs  Lab 04/03/21 2020  INR 0.9   Cardiac Enzymes: No results for input(s): CKTOTAL, CKMB, CKMBINDEX, TROPONINI in the last 168 hours. BNP (last 3 results) No results for input(s): PROBNP in the  last 8760 hours. HbA1C: No results for input(s): HGBA1C in the last 72 hours. CBG: No results for input(s): GLUCAP in the last 168 hours. Lipid Profile: No results for input(s): CHOL, HDL, LDLCALC, TRIG, CHOLHDL, LDLDIRECT in the last 72 hours. Thyroid Function Tests: No results for input(s): TSH, T4TOTAL, FREET4, T3FREE, THYROIDAB in the last 72 hours. Anemia Panel: No results for input(s): VITAMINB12, FOLATE, FERRITIN, TIBC, IRON, RETICCTPCT in the last 72 hours. Urine analysis: No results found for: COLORURINE, APPEARANCEUR, LABSPEC, PHURINE, GLUCOSEU, HGBUR, BILIRUBINUR, KETONESUR, PROTEINUR, UROBILINOGEN, NITRITE, LEUKOCYTESUR Sepsis Labs: @LABRCNTIP (procalcitonin:4,lacticidven:4) ) Recent Results (from the past 240 hour(s))  Resp Panel by RT-PCR (Flu A&B, Covid) Nasopharyngeal Swab     Status: Abnormal   Collection Time: 04/03/21  8:06 PM   Specimen: Nasopharyngeal Swab; Nasopharyngeal(NP) swabs in vial transport medium  Result Value Ref Range Status   SARS Coronavirus 2 by RT PCR NEGATIVE NEGATIVE Final    Comment: (NOTE) SARS-CoV-2 target nucleic acids are NOT DETECTED.  The SARS-CoV-2 RNA is generally detectable in upper respiratory specimens during  the acute phase of infection. The lowest concentration of SARS-CoV-2 viral copies this assay can detect is 138 copies/mL. A negative result does not preclude SARS-Cov-2 infection and should not be used as the sole basis for treatment or other patient management decisions. A negative result may occur with  improper specimen collection/handling, submission of specimen other than nasopharyngeal swab, presence of viral mutation(s) within the areas targeted by this assay, and inadequate number of viral copies(<138 copies/mL). A negative result must be combined with clinical observations, patient history, and epidemiological information. The expected result is Negative.  Fact Sheet for Patients:   BloggerCourse.com  Fact Sheet for Healthcare Providers:  SeriousBroker.it  This test is no t yet approved or cleared by the [place] FDA and  has been authorized for detection and/or diagnosis of SARS-CoV-2 by FDA under an Emergency Use Authorization (EUA). This EUA will remain  in effect (meaning this test can be used) for the duration of the COVID-19 declaration under Section 564(b)(1) of the Act, 21 U.S.C.section 360bbb-3(b)(1), unless the authorization is terminated  or revoked sooner.       Influenza A by PCR POSITIVE (A) NEGATIVE Final   Influenza B by PCR NEGATIVE NEGATIVE Final    Comment: (NOTE) The Xpert Xpress SARS-CoV-2/FLU/RSV plus assay is intended as an aid in the diagnosis of influenza from Nasopharyngeal swab specimens and should not be used as a sole basis for treatment. Nasal washings and aspirates are unacceptable for Xpert Xpress SARS-CoV-2/FLU/RSV testing.  Fact Sheet for Patients: BloggerCourse.com  Fact Sheet for Healthcare Providers: SeriousBroker.it  This test is not yet approved or cleared by the [place] FDA and has been authorized for detection and/or diagnosis of SARS-CoV-2 by FDA under an Emergency Use Authorization (EUA). This EUA will remain in effect (meaning this test can be used) for the duration of the COVID-19 declaration under Section 564(b)(1) of the Act, 21 U.S.C. section 360bbb-3(b)(1), unless the authorization is terminated or revoked.  Performed at [facility], [address] [place], [place] [address]      Radiological Exams on Admission: DG Chest Port 1 View  Result Date: 04/03/2021 CLINICAL DATA:  Fever and body aches with flu-like symptoms. EXAM: PORTABLE CHEST 1 VIEW COMPARISON:  October 27, 2020 FINDINGS: The heart size and mediastinal contours are within normal limits. Mild calcification of the  aortic arch is seen. Both lungs are clear. The visualized skeletal structures are unremarkable. IMPRESSION: No active disease. Electronically Signed   By: Aram Candela M.D.   On: 04/03/2021 20:35    EKG: Independently reviewed.  Normal sinus rhythm.  Assessment/Plan Principal Problem:   Sepsis (HCC) Active Problems:   Diarrhea   Essential hypertension   PVD (peripheral vascular disease) (HCC)   HLD (hyperlipidemia)   Influenza A    Sepsis likely from influenza and diarrhea -patient on admission was having systolic blood pressure of 70s with tachycardia and fever consistent with sepsis physiology.  Blood cultures have been ordered empirically started on antibiotics we will add Tamiflu since patient has influenza A.  Follow cultures continue hydration.  Hold antihypertensives. Acute renal failure -creatinine has worsened from 0.9 in June 2022 it is around 1.2 likely from diarrhea.  Continue hydration and follow metabolic panel. Diarrhea could be from viral infection.  We will check stool studies.  Patient denies having used any antibiotics recently or recent travel. Hypertension we will keep patient on p.o. and IV hydralazine hold amlodipine since patient initially presented with hypotension. History of  peripheral vascular disease status post revascularization no acute issues at this time. Hyperlipidemia on statins.   DVT prophylaxis: Lovenox. Code Status: Full code. Family Communication: Discussed with patient. Disposition Plan: Home. Consults called: None. Admission status: Observation.   Eduard Clos MD [facility] Pager [phone].  If 7PM-7AM, please contact night-coverage [email or web address] Password [facility]  04/03/2021, 11:08 PM

## 2021-04-03 NOTE — Progress Notes (Signed)
Pharmacy Antibiotic Note  Christy Heath is a 70 y.o. female admitted on 04/03/2021 with body aches, swollen glands in the neck, diarrhea and chills.  Pharmacy has been consulted for vancomycin and cefepime for sepsis.  1st doses given in ED  Plan: Vancomycin 1000mg  IV q36h (AUC 500.1, Scr 1.24) Cefepime 2gm IV q12h Follow renal function, cultures and clinical course  Height: 5' 1.5" (156.2 cm) Weight: 60.3 kg (133 lb) IBW/kg (Calculated) : 48.95  Temp (24hrs), Avg:100.4 F (38 C), Min:100.4 F (38 C), Max:100.4 F (38 C)  Recent Labs  Lab 04/03/21 2020 04/03/21 2212  WBC 10.4  --   CREATININE  --  1.24*  LATICACIDVEN 1.2  --     Estimated Creatinine Clearance: 34.6 mL/min (A) (by C-G formula based on SCr of 1.24 mg/dL (H)).    No Known Allergies  Antimicrobials this admission: 11/28 vanc >> 11/28 cefepime >> 11/28 flagyl x 1  Dose adjustments this admission:   Microbiology results: 11/28 BCx:  11/28 UCx:   Thank you for allowing pharmacy to be a part of this patient's care.  12/28 RPh 04/03/2021, 11:20 PM

## 2021-04-03 NOTE — Sepsis Progress Note (Signed)
Monitoring for the code sepsis protocol. °

## 2021-04-03 NOTE — ED Provider Notes (Signed)
[facility]-EMERGENCY DEPT Provider Note   CSN: [id number] Arrival date & time: 04/03/21  1839     History Chief Complaint  Patient presents with   Flu like symptoms   Weakness    Christy Heath is a 70 y.o. female.  Patient started to feel ill on Saturday.  Complaint is headache body aches gland swollen in the neck diarrhea chills no real abdominal pain.  And some cough temp upon arrival was 100.4.  Initial blood pressure was 77/58.  Apparently according to nurses it then came up.  Patient brought in by EMS.  No blood in the bowel movements.  No known sick exposure.  Past medical history sniffing for hypertension hyperlipidemia.  Patient dropped her pressures down again into the 70s systolic.  So septic protocol was initiated.  Clinically this sounds like it may be influenza.  She had 2 negative home COVID test.  Patient is alert and mentating fine.      Past Medical History:  Diagnosis Date   Hyperlipidemia    Hypertension     There are no problems to display for this patient.   Past Surgical History:  Procedure Laterality Date   ABDOMINAL AORTOGRAM W/LOWER EXTREMITY N/A 03/23/2020   Procedure: ABDOMINAL AORTOGRAM W/LOWER EXTREMITY;  Surgeon: Iran Ouch, MD;  Location: [facility];  Service: Cardiovascular;  Laterality: N/A;   PERIPHERAL VASCULAR ATHERECTOMY Left 03/23/2020   Procedure: PERIPHERAL VASCULAR ATHERECTOMY;  Surgeon: Iran Ouch, MD;  Location: [facility];  Service: Cardiovascular;  Laterality: Left;   PERIPHERAL VASCULAR BALLOON ANGIOPLASTY Left 03/23/2020   Procedure: PERIPHERAL VASCULAR BALLOON ANGIOPLASTY;  Surgeon: Iran Ouch, MD;  Location: [facility];  Service: Cardiovascular;  Laterality: Left;     OB History   No obstetric history on file.     History reviewed. No pertinent family history.  Social History   Tobacco Use   Smoking status: Former    Types: Cigarettes   Smokeless  tobacco: Never  Substance Use Topics   Alcohol use: Yes    Comment: Occasional   Drug use: Not Currently    Types: Marijuana    Home Medications Prior to Admission medications   Medication Sig Start Date End Date Taking? Authorizing Provider  amLODipine (NORVASC) 5 MG tablet Take 1 tablet (5 mg total) by mouth daily. 07/19/20 10/17/20  Iran Ouch, MD  Black Currant Seed Oil 500 MG CAPS Take 1,000 mg by mouth.    [provider]  Cholecalciferol (VITAMIN D3) 250 MCG (10000 UT) TABS Take 10,000 Units by mouth every Saturday.    [provider]  clopidogrel (PLAVIX) 75 MG tablet Take 1 tablet by mouth once daily 12/08/20   Iran Ouch, MD  loperamide (IMODIUM) 2 MG capsule Take 2 mg by mouth as needed for diarrhea or loose stools.    [provider]  METHYLCELLULOSE OP Take 1,600 mg by mouth. Sea Moss dietary supplement/gel    [provider]  Multiple Vitamins-Minerals (EYE SUPPORT PO) Take 1 tablet by mouth daily. Eyesight Max    [provider]  ondansetron (ZOFRAN-ODT) 8 MG disintegrating tablet Take 1 tablet (8 mg total) by mouth every 8 (eight) hours as needed for nausea or vomiting. 10/27/20   Koleen Distance, MD  rosuvastatin (CRESTOR) 20 MG tablet Take 1 tablet (20 mg total) by mouth daily. 03/15/20 12/27/20  Iran Ouch, MD  spironolactone (ALDACTONE) 25 MG tablet Take 25 mg by  mouth daily.    [provider]  tolterodine (DETROL LA) 2 MG 24 hr capsule Take 2 mg by mouth daily. 02/09/20   [provider]    Allergies    Patient has no known allergies.  Review of Systems   Review of Systems  Constitutional:  Positive for chills. Negative for fever.  HENT:  Negative for ear pain and sore throat.   Eyes:  Negative for pain and visual disturbance.  Respiratory:  Positive for cough. Negative for shortness of breath.   Cardiovascular:  Negative for chest pain and palpitations.  Gastrointestinal:  Positive for  diarrhea, nausea and vomiting. Negative for abdominal pain and blood in stool.  Genitourinary:  Negative for dysuria and hematuria.  Musculoskeletal:  Positive for myalgias. Negative for arthralgias and back pain.  Skin:  Negative for color change and rash.  Neurological:  Positive for headaches. Negative for seizures and syncope.  All other systems reviewed and are negative.  Physical Exam Updated Vital Signs BP (!) 74/65   Pulse 76   Temp (!) 100.4 F (38 C) (Oral)   Resp 16   Ht 1.562 m (5' 1.5")   Wt 60.3 kg   SpO2 97%   BMI 24.72 kg/m   Physical Exam Vitals and nursing note reviewed.  Constitutional:      General: She is not in acute distress.    Appearance: Normal appearance. She is well-developed.  HENT:     Head: Normocephalic and atraumatic.     Mouth/Throat:     Mouth: Mucous membranes are dry.  Eyes:     Extraocular Movements: Extraocular movements intact.     Conjunctiva/sclera: Conjunctivae normal.     Pupils: Pupils are equal, round, and reactive to light.  Cardiovascular:     Rate and Rhythm: Normal rate and regular rhythm.     Heart sounds: No murmur heard. Pulmonary:     Effort: Pulmonary effort is normal. No respiratory distress.     Breath sounds: Normal breath sounds.  Abdominal:     Palpations: Abdomen is soft.     Tenderness: There is no abdominal tenderness. There is no guarding.  Musculoskeletal:        General: No swelling.     Cervical back: Normal range of motion and neck supple.  Skin:    General: Skin is warm and dry.     Capillary Refill: Capillary refill takes less than 2 seconds.  Neurological:     General: No focal deficit present.     Mental Status: She is alert and oriented to person, place, and time.     Cranial Nerves: No cranial nerve deficit.     Sensory: No sensory deficit.     Motor: No weakness.  Psychiatric:        Mood and Affect: Mood normal.    ED Results / Procedures / Treatments   Labs (all labs ordered are  listed, but only abnormal results are displayed) Labs Reviewed  RESP PANEL BY RT-PCR (FLU A&B, COVID) ARPGX2 - Abnormal; Notable for the following components:      Result Value   Influenza A by PCR POSITIVE (*)    All other components within normal limits  CBC WITH DIFFERENTIAL/PLATELET - Abnormal; Notable for the following components:   Neutro Abs 8.5 (*)    Lymphs Abs 0.6 (*)    Monocytes Absolute 1.2 (*)    All other components within normal limits  APTT - Abnormal; Notable for the following components:  aPTT 23 (*)    All other components within normal limits  CULTURE, BLOOD (ROUTINE X 2)  CULTURE, BLOOD (ROUTINE X 2)  URINE CULTURE  LACTIC ACID, PLASMA  PROTIME-INR  URINALYSIS, ROUTINE W REFLEX MICROSCOPIC  COMPREHENSIVE METABOLIC PANEL    EKG EKG Interpretation  Date/Time:  Monday April 03 2021 20:21:42 EST Ventricular Rate:  79 PR Interval:  136 QRS Duration: 81 QT Interval:  378 QTC Calculation: 434 R Axis:   49 Text Interpretation: Sinus rhythm Consider right atrial enlargement Abnormal R-wave progression, early transition No previous ECGs available Confirmed by Fredia Sorrow [phone]) on 04/03/2021 8:46:48 PM  Radiology DG Chest Port 1 View  Result Date: 04/03/2021 CLINICAL DATA:  Fever and body aches with flu-like symptoms. EXAM: PORTABLE CHEST 1 VIEW COMPARISON:  October 27, 2020 FINDINGS: The heart size and mediastinal contours are within normal limits. Mild calcification of the aortic arch is seen. Both lungs are clear. The visualized skeletal structures are unremarkable. IMPRESSION: No active disease. Electronically Signed   By: Virgina Norfolk M.D.   On: 04/03/2021 20:35    Procedures Procedures   Medications Ordered in ED Medications  lactated ringers infusion ( Intravenous New Bag/Given 04/03/21 2039)  metroNIDAZOLE (FLAGYL) IVPB 500 mg (500 mg Intravenous New Bag/Given 04/03/21 2050)  vancomycin (VANCOREADY) IVPB 1250 mg/250 mL (1,250 mg  Intravenous New Bag/Given 04/03/21 2042)  lactated ringers bolus 1,000 mL (1,000 mLs Intravenous New Bag/Given 04/03/21 2039)    And  lactated ringers bolus 1,000 mL (1,000 mLs Intravenous New Bag/Given 04/03/21 2039)  ceFEPIme (MAXIPIME) 2 g in sodium chloride 0.9 % 100 mL IVPB (0 g Intravenous Stopped 04/03/21 2055)    ED Course  I have reviewed the triage vital signs and the nursing notes.  Pertinent labs & imaging results that were available during my care of the patient were reviewed by me and considered in my medical decision making (see chart for details).    MDM Rules/Calculators/A&P                         CRITICAL CARE Performed by: Fredia Sorrow Total critical care time: 60 minutes Critical care time was exclusive of separately billable procedures and treating other patients. Critical care was necessary to treat or prevent imminent or life-threatening deterioration. Critical care was time spent personally by me on the following activities: development of treatment plan with patient and/or surrogate as well as nursing, discussions with consultants, evaluation of patient's response to treatment, examination of patient, obtaining history from patient or surrogate, ordering and performing treatments and interventions, ordering and review of laboratory studies, ordering and review of radiographic studies, pulse oximetry and re-evaluation of patient's condition.   Patient meeting sepsis criteria based on fever and hypotension.  Respiratory rate around 18 as well.  Oxygen sats are good on room air.  Sepsis protocol initiated including 30 cc/kg of fluid.  And broad-spectrum antibiotics.  Labs are still pending in particular lactic acid.  White blood cell count is also pending.  Hemoglobin is 13.5.  Chest x-ray is negative for any active disease.  Patient's blood pressures are now up into the [id number] systolic.  Patient receiving the fluid resuscitation.  Lactic acid was normal influenza a  was positive COVID negative.  We will discussed with hospitalist for admission.  Final Clinical Impression(s) / ED Diagnoses Final diagnoses:  Sepsis, due to unspecified organism, unspecified whether acute organ dysfunction present ([place])  Influenza A    Rx /  DC Orders ED Discharge Orders     None        Fredia Sorrow, MD 04/03/21 2149

## 2021-04-03 NOTE — ED Triage Notes (Signed)
Pt arrived via EMS from home. Pt c/o of flu-like symptoms since Saturday and increased weakness and onset of incontinence this afternoon. Pt is A&O x4. Pt repors HA, chills, and muscle spasms at this moment.

## 2021-04-04 ENCOUNTER — Encounter (HOSPITAL_COMMUNITY): Payer: Self-pay | Admitting: Family Medicine

## 2021-04-04 DIAGNOSIS — J101 Influenza due to other identified influenza virus with other respiratory manifestations: Secondary | ICD-10-CM | POA: Diagnosis present

## 2021-04-04 DIAGNOSIS — E785 Hyperlipidemia, unspecified: Secondary | ICD-10-CM | POA: Diagnosis present

## 2021-04-04 DIAGNOSIS — Z79899 Other long term (current) drug therapy: Secondary | ICD-10-CM | POA: Diagnosis not present

## 2021-04-04 DIAGNOSIS — I739 Peripheral vascular disease, unspecified: Secondary | ICD-10-CM

## 2021-04-04 DIAGNOSIS — A419 Sepsis, unspecified organism: Secondary | ICD-10-CM | POA: Diagnosis not present

## 2021-04-04 DIAGNOSIS — Z20822 Contact with and (suspected) exposure to covid-19: Secondary | ICD-10-CM | POA: Diagnosis present

## 2021-04-04 DIAGNOSIS — I10 Essential (primary) hypertension: Secondary | ICD-10-CM | POA: Diagnosis present

## 2021-04-04 DIAGNOSIS — R197 Diarrhea, unspecified: Secondary | ICD-10-CM | POA: Diagnosis not present

## 2021-04-04 DIAGNOSIS — N179 Acute kidney failure, unspecified: Secondary | ICD-10-CM | POA: Diagnosis present

## 2021-04-04 DIAGNOSIS — K529 Noninfective gastroenteritis and colitis, unspecified: Secondary | ICD-10-CM | POA: Diagnosis present

## 2021-04-04 DIAGNOSIS — I959 Hypotension, unspecified: Secondary | ICD-10-CM | POA: Diagnosis present

## 2021-04-04 DIAGNOSIS — Z87891 Personal history of nicotine dependence: Secondary | ICD-10-CM | POA: Diagnosis not present

## 2021-04-04 DIAGNOSIS — Z7902 Long term (current) use of antithrombotics/antiplatelets: Secondary | ICD-10-CM | POA: Diagnosis not present

## 2021-04-04 DIAGNOSIS — I70202 Unspecified atherosclerosis of native arteries of extremities, left leg: Secondary | ICD-10-CM | POA: Diagnosis present

## 2021-04-04 DIAGNOSIS — Z9862 Peripheral vascular angioplasty status: Secondary | ICD-10-CM | POA: Diagnosis not present

## 2021-04-04 DIAGNOSIS — R0781 Pleurodynia: Secondary | ICD-10-CM | POA: Diagnosis present

## 2021-04-04 DIAGNOSIS — A4189 Other specified sepsis: Secondary | ICD-10-CM | POA: Diagnosis present

## 2021-04-04 LAB — COMPREHENSIVE METABOLIC PANEL
ALT: 13 U/L (ref 0–44)
AST: 20 U/L (ref 15–41)
Albumin: 3.3 g/dL — ABNORMAL LOW (ref 3.5–5.0)
Alkaline Phosphatase: 63 U/L (ref 38–126)
Anion gap: 9 (ref 5–15)
BUN: 16 mg/dL (ref 8–23)
CO2: 20 mmol/L — ABNORMAL LOW (ref 22–32)
Calcium: 8.4 mg/dL — ABNORMAL LOW (ref 8.9–10.3)
Chloride: 105 mmol/L (ref 98–111)
Creatinine, Ser: 0.81 mg/dL (ref 0.44–1.00)
GFR, Estimated: >60 mL/min (ref >60)
Glucose, Bld: 119 mg/dL — ABNORMAL HIGH (ref 70–99)
Potassium: 3.8 mmol/L (ref 3.5–5.1)
Sodium: 134 mmol/L — ABNORMAL LOW (ref 135–145)
Total Bilirubin: 0.6 mg/dL (ref 0.3–1.2)
Total Protein: 6.4 g/dL — ABNORMAL LOW (ref 6.5–8.1)

## 2021-04-04 LAB — CBC WITH DIFFERENTIAL/PLATELET
Abs Immature Granulocytes: 0.01 10*3/uL (ref 0.00–0.07)
Basophils Absolute: 0 10*3/uL (ref 0.0–0.1)
Basophils Relative: 0 %
Eosinophils Absolute: 0 10*3/uL (ref 0.0–0.5)
Eosinophils Relative: 0 %
HCT: 34.6 % — ABNORMAL LOW (ref 36.0–46.0)
Hemoglobin: 11.5 g/dL — ABNORMAL LOW (ref 12.0–15.0)
Immature Granulocytes: 0 %
Lymphocytes Relative: 15 %
Lymphs Abs: 0.9 10*3/uL (ref 0.7–4.0)
MCH: 31.2 pg (ref 26.0–34.0)
MCHC: 33.2 g/dL (ref 30.0–36.0)
MCV: 93.8 fL (ref 80.0–100.0)
Monocytes Absolute: 0.8 10*3/uL (ref 0.1–1.0)
Monocytes Relative: 13 %
Neutro Abs: 4.4 10*3/uL (ref 1.7–7.7)
Neutrophils Relative %: 72 %
Platelets: 174 10*3/uL (ref 150–400)
RBC: 3.69 MIL/uL — ABNORMAL LOW (ref 3.87–5.11)
RDW: 14.4 % (ref 11.5–15.5)
WBC: 6.2 10*3/uL (ref 4.0–10.5)
nRBC: 0 % (ref 0.0–0.2)

## 2021-04-04 MED ORDER — OSELTAMIVIR PHOSPHATE 30 MG PO CAPS
30.0000 mg | ORAL_CAPSULE | Freq: Every day | ORAL | Status: DC
  Administered 2021-04-04 (×2): 30 mg via ORAL
  Filled 2021-04-04 (×2): qty 1

## 2021-04-04 MED ORDER — METRONIDAZOLE 500 MG/100ML IV SOLN
INTRAVENOUS | Status: AC
  Filled 2021-04-04: qty 100

## 2021-04-04 MED ORDER — ENSURE ENLIVE PO LIQD
237.0000 mL | Freq: Two times a day (BID) | ORAL | Status: DC
  Administered 2021-04-05 – 2021-04-06 (×2): 237 mL via ORAL

## 2021-04-04 MED ORDER — OSELTAMIVIR PHOSPHATE 30 MG PO CAPS
30.0000 mg | ORAL_CAPSULE | Freq: Two times a day (BID) | ORAL | Status: DC
  Administered 2021-04-04 – 2021-04-06 (×4): 30 mg via ORAL
  Filled 2021-04-04 (×4): qty 1

## 2021-04-04 MED ORDER — SODIUM CHLORIDE 0.9 % IV SOLN
INTRAVENOUS | Status: AC
  Filled 2021-04-04: qty 2

## 2021-04-04 MED ORDER — LOPERAMIDE HCL 2 MG PO CAPS: 2.0000 mg | ORAL_CAPSULE | ORAL | Status: DC | PRN

## 2021-04-04 MED ORDER — HYDRALAZINE HCL 20 MG/ML IJ SOLN: 10.0000 mg | INTRAMUSCULAR | Status: DC | PRN

## 2021-04-04 MED ORDER — ENOXAPARIN SODIUM 40 MG/0.4ML IJ SOSY
PREFILLED_SYRINGE | INTRAMUSCULAR | Status: AC
  Filled 2021-04-04: qty 0.4

## 2021-04-04 MED ORDER — ACETAMINOPHEN 325 MG PO TABS
ORAL_TABLET | ORAL | Status: AC
  Filled 2021-04-04: qty 2

## 2021-04-04 MED ORDER — VANCOMYCIN HCL IN DEXTROSE 1-5 GM/200ML-% IV SOLN
1000.0000 mg | INTRAVENOUS | Status: DC
  Administered 2021-04-04 – 2021-04-05 (×2): 1000 mg via INTRAVENOUS
  Filled 2021-04-04 (×2): qty 200

## 2021-04-04 MED ORDER — PHENOL 1.4 % MT LIQD
1.0000 | OROMUCOSAL | Status: DC | PRN
  Administered 2021-04-04: 1 via OROMUCOSAL
  Filled 2021-04-04: qty 177

## 2021-04-04 NOTE — Progress Notes (Signed)
PHARMACY NOTE:  ANTIMICROBIAL RENAL DOSAGE ADJUSTMENT  Current antimicrobial regimen includes a mismatch between antimicrobial dosage and estimated renal function.  As per policy approved by the Pharmacy & Therapeutics and Medical Executive Committees, the antimicrobial dosage will be adjusted accordingly.  Current antimicrobial dosage:  Tamiflu 30 qday  Indication: Influenza  Renal Function:  Estimated Creatinine Clearance: 53 mL/min (by C-G formula based on SCr of 0.81 mg/dL). []      On intermittent HD, scheduled: []      On CRRT    Antimicrobial dosage has been changed to:  Tamiflu 30 bid x 5 days  Thank you for allowing pharmacy to be a part of this patient's care.  , Pharm.D 04/04/2021 10:33 AM

## 2021-04-04 NOTE — Assessment & Plan Note (Signed)
Acute on chronic per patient. GI pathogen. Low risk for C. Difficile. Could be secondary to viral illness. -Imodium prn -Follow up GI pathogen panel

## 2021-04-04 NOTE — Hospital Course (Signed)
Christy Heath is a 70 y.o. female with a history of PVD, hypertension, hyperlipidemia, tobacco use. Patient presented secondary to flu-like symptoms, diarrhea, body aches. She was found to be influenza A positive and started on Tamiflu. She also met sepsis criteria and had associated hypotension, responsive to fluid resuscitation. Blood and urine cultures obtained and are pending.

## 2021-04-04 NOTE — ED Notes (Signed)
Pt attached to cardiac monitor x3. Re-adjusted in ED stretcher. A&O x4. VSS. NAD. LR infusing at 150 mL/hr to 22g right hand peripheral IV.

## 2021-04-04 NOTE — Assessment & Plan Note (Signed)
Continue Crestor 

## 2021-04-04 NOTE — Progress Notes (Signed)
Pharmacy Antibiotic Note  Christy Heath is a 70 y.o. female admitted on 04/03/2021 with body aches, swollen glands in the neck, diarrhea and chills.  Pharmacy has been consulted for vancomycin and cefepime for sepsis.  1st doses given in ED  04/04/2021 WBC WNL @ 6.2; SCr 1.24>>0.81 Influenza A positive   Plan: Change Vancomycin to 1000 mg IV q24h (AUC 515.7, Scr 0.81) Cefepime 2gm IV q12h Flagyl 500 IV q12 per MD Tamiflu 30 bid Rec: DC vanc/cefepime/flagyl & continue tamiflu  Height: 5' 1.5" (156.2 cm) Weight: 60.3 kg (133 lb) IBW/kg (Calculated) : 48.95  Temp (24hrs), Avg:99.6 F (37.6 C), Min:98.5 F (36.9 C), Max:100.4 F (38 C)  Recent Labs  Lab 04/03/21 2020 04/03/21 2212 04/04/21 0257  WBC 10.4  --  6.2  CREATININE  --  1.24* 0.81  LATICACIDVEN 1.2  --   --      Estimated Creatinine Clearance: 53 mL/min (by C-G formula based on SCr of 0.81 mg/dL).    No Known Allergies Antimicrobials this admission: 11/28 vanc >> 11/28 cefepime >> 11/28 flagyl >> 11/28 tamiflu>>  Dose adjustments this admission:  11/29 V 1 gm q36>> 1 gm q24   Microbiology results: 11/28 BCx: sent 11/28 UCx: sent 11/28 Flu A + 11/29 GI  panel: ordered  Thank you for allowing pharmacy to be a part of this patient's care.  Herby Abraham, Pharm.D 04/04/2021 11:51 AM

## 2021-04-04 NOTE — Progress Notes (Signed)
PROGRESS NOTE    Rmani Kapusta  OVZ:[id number] DOB: 12-Apr-1951 DOA: 04/03/2021 PCP: Hayden Rasmussen, MD   Brief Narrative: Christy Heath is a 70 y.o. female with a history of PVD, hypertension, hyperlipidemia, tobacco use. Patient presented secondary to flu-like symptoms, diarrhea, body aches. She was found to be influenza A positive and started on Tamiflu. She also met sepsis criteria and had associated hypotension, responsive to fluid resuscitation. Blood and urine cultures obtained and are pending.   Assessment & Plan:   * Sepsis ([place]) Present on admission. Likely secondary to influenza but concern for possible bacterial infection with hypotension on admission. Blood and urine cultures obtained and are pending. Empiric Vancomycin, Cefepime and Flagyl started on admission. -Continue Vanc/Cefepime/Flagyl until negative cultures -Procalcitonin  Influenza A Room air. No infiltrates on x-ray. Tmax of 100.4 F overnight. -Continue Tamiflu  HLD (hyperlipidemia) -Continue Crestor  PVD (peripheral vascular disease) (HCC) -Continue Plavix  Essential hypertension Patient is on amlodipine, losartan-hydrochlorothiazide as an outpatient which were held secondary to hypotension. -Restart antihypertensives pending sustained improvement of blood pressure  Diarrhea Acute on chronic per patient. GI pathogen. Low risk for C. Difficile. Could be secondary to viral illness. -Imodium prn -Follow up GI pathogen panel      -----     DVT prophylaxis: Lovenox Code Status:   Code Status: Full Code Family Communication: None at bedside Disposition Plan: Discharge in 24 hours pending negative culture data, stable blood pressure   Consultants:  None  Procedures:  None  Antimicrobials: Vancomycin Cefepime Flagyl Tamiflu    Subjective: Patient feels much better today. Still with significant diarrhea. Some chest pain with cough. No dyspnea.  Objective: Vitals:   04/04/21  0545 04/04/21 0645 04/04/21 0742 04/04/21 1030  BP: 114/89 131/64 126/70 (!) 141/69  Pulse: 70 69 63 69  Resp: 13 14 18  (!) 8  Temp:   98.5 F (36.9 C)   TempSrc:   Oral   SpO2: 98% 96% 100% 100%  Weight:      Height:        Intake/Output Summary (Last 24 hours) at 04/04/2021 1239 Last data filed at 04/04/2021 4128 Gross per 24 hour  Intake 2948.63 ml  Output 1000 ml  Net 1948.63 ml   Filed Weights   04/03/21 1905  Weight: 60.3 kg    Examination:  General exam: Appears calm and comfortable Respiratory system: Diminished. Respiratory effort normal. Cardiovascular system: S1 & S2 heard, RRR. No murmurs, rubs, gallops or clicks. Gastrointestinal system: Abdomen is nondistended, soft and nontender. No organomegaly or masses felt. Normal bowel sounds heard. Central nervous system: Alert and oriented. No focal neurological deficits. Musculoskeletal: No edema. No calf tenderness Skin: No cyanosis. No rashes Psychiatry: Judgement and insight appear normal. Mood & affect appropriate.     Data Reviewed: I have personally reviewed following labs and imaging studies  CBC Lab Results  Component Value Date   WBC 6.2 04/04/2021   RBC 3.69 (L) 04/04/2021   HGB 11.5 (L) 04/04/2021   HCT 34.6 (L) 04/04/2021   MCV 93.8 04/04/2021   MCH 31.2 04/04/2021   PLT 174 04/04/2021   MCHC 33.2 04/04/2021   RDW 14.4 04/04/2021   LYMPHSABS 0.9 04/04/2021   MONOABS 0.8 04/04/2021   EOSABS 0.0 04/04/2021   BASOSABS 0.0 78/67/6720     Last metabolic panel Lab Results  Component Value Date   NA 134 (L) 04/04/2021   K 3.8 04/04/2021   CL 105 04/04/2021   CO2 20 (L)  04/04/2021   BUN 16 04/04/2021   CREATININE 0.81 04/04/2021   GLUCOSE 119 (H) 04/04/2021   GFRNONAA >60 04/04/2021   GFRAA 91 03/15/2020   CALCIUM 8.4 (L) 04/04/2021   PROT 6.4 (L) 04/04/2021   ALBUMIN 3.3 (L) 04/04/2021   BILITOT 0.6 04/04/2021   ALKPHOS 63 04/04/2021   AST 20 04/04/2021   ALT 13 04/04/2021    ANIONGAP 9 04/04/2021    CBG (last 3)  No results for input(s): GLUCAP in the last 72 hours.   GFR: Estimated Creatinine Clearance: 53 mL/min (by C-G formula based on SCr of 0.81 mg/dL).  Coagulation Profile: Recent Labs  Lab 04/03/21 2020  INR 0.9    Recent Results (from the past 240 hour(s))  Resp Panel by RT-PCR (Flu A&B, Covid) Nasopharyngeal Swab     Status: Abnormal   Collection Time: 04/03/21  8:06 PM   Specimen: Nasopharyngeal Swab; Nasopharyngeal(NP) swabs in vial transport medium  Result Value Ref Range Status   SARS Coronavirus 2 by RT PCR NEGATIVE NEGATIVE Final    Comment: (NOTE) SARS-CoV-2 target nucleic acids are NOT DETECTED.  The SARS-CoV-2 RNA is generally detectable in upper respiratory specimens during the acute phase of infection. The lowest concentration of SARS-CoV-2 viral copies this assay can detect is 138 copies/mL. A negative result does not preclude SARS-Cov-2 infection and should not be used as the sole basis for treatment or other patient management decisions. A negative result may occur with  improper specimen collection/handling, submission of specimen other than nasopharyngeal swab, presence of viral mutation(s) within the areas targeted by this assay, and inadequate number of viral copies(<138 copies/mL). A negative result must be combined with clinical observations, patient history, and epidemiological information. The expected result is Negative.  Fact Sheet for Patients:  EntrepreneurPulse.com.au  Fact Sheet for Healthcare Providers:  IncredibleEmployment.be  This test is no t yet approved or cleared by the [place] FDA and  has been authorized for detection and/or diagnosis of SARS-CoV-2 by FDA under an Emergency Use Authorization (EUA). This EUA will remain  in effect (meaning this test can be used) for the duration of the COVID-19 declaration under Section 564(b)(1) of the Act,  21 U.S.C.section 360bbb-3(b)(1), unless the authorization is terminated  or revoked sooner.       Influenza A by PCR POSITIVE (A) NEGATIVE Final   Influenza B by PCR NEGATIVE NEGATIVE Final    Comment: (NOTE) The Xpert Xpress SARS-CoV-2/FLU/RSV plus assay is intended as an aid in the diagnosis of influenza from Nasopharyngeal swab specimens and should not be used as a sole basis for treatment. Nasal washings and aspirates are unacceptable for Xpert Xpress SARS-CoV-2/FLU/RSV testing.  Fact Sheet for Patients: EntrepreneurPulse.com.au  Fact Sheet for Healthcare Providers: IncredibleEmployment.be  This test is not yet approved or cleared by the [place] FDA and has been authorized for detection and/or diagnosis of SARS-CoV-2 by FDA under an Emergency Use Authorization (EUA). This EUA will remain in effect (meaning this test can be used) for the duration of the COVID-19 declaration under Section 564(b)(1) of the Act, 21 U.S.C. section 360bbb-3(b)(1), unless the authorization is terminated or revoked.  Performed at [facility], [place] [address] [place], [place] [address]         Radiology Studies: [facility] Chest Port 1 View  Result Date: 04/03/2021 CLINICAL DATA:  Fever and body aches with flu-like symptoms. EXAM: PORTABLE CHEST 1 VIEW COMPARISON:  October 27, 2020 FINDINGS: The heart size and mediastinal contours are  within normal limits. Mild calcification of the aortic arch is seen. Both lungs are clear. The visualized skeletal structures are unremarkable. IMPRESSION: No active disease. Electronically Signed   By: Virgina Norfolk M.D.   On: 04/03/2021 20:35        Scheduled Meds:  clopidogrel  75 mg Oral Daily   enoxaparin (LOVENOX) injection  40 mg Subcutaneous Q24H   oseltamivir  30 mg Oral BID   rosuvastatin  20 mg Oral Daily   Continuous Infusions:  ceFEPime (MAXIPIME) IV 2 g (04/04/21 0655)   metronidazole  500 mg (04/04/21 1234)   vancomycin       LOS: 0 days     Cordelia Poche, MD [facility] 04/04/2021, 12:39 PM  If 7PM-7AM, please contact night-coverage [email or web address]

## 2021-04-04 NOTE — Plan of Care (Signed)
  Problem: Activity: Goal: Risk for activity intolerance will decrease Outcome: Progressing   Problem: Nutrition: Goal: Adequate nutrition will be maintained Outcome: Progressing   Problem: Coping: Goal: Level of anxiety will decrease Outcome: Progressing   Problem: Elimination: Goal: Will not experience complications related to bowel motility Outcome: Progressing   

## 2021-04-04 NOTE — ED Notes (Signed)
Report sent to floor nurse.  

## 2021-04-04 NOTE — ED Notes (Signed)
This nurse attempted to call report to floor. Floor nurse unable to take report at this time. Will re-attempt report for a third time.

## 2021-04-04 NOTE — Assessment & Plan Note (Addendum)
Room air. No infiltrates on x-ray. Tmax of 100.4 F overnight. -Continue Tamiflu

## 2021-04-04 NOTE — Assessment & Plan Note (Addendum)
Present on admission. Likely secondary to influenza but concern for possible bacterial infection with hypotension on admission. Blood and urine cultures obtained and are pending. Empiric Vancomycin, Cefepime and Flagyl started on admission. -Continue Vanc/Cefepime/Flagyl until negative cultures -Procalcitonin

## 2021-04-04 NOTE — Assessment & Plan Note (Signed)
Patient is on amlodipine, losartan-hydrochlorothiazide as an outpatient which were held secondary to hypotension. -Restart antihypertensives pending sustained improvement of blood pressure

## 2021-04-04 NOTE — Assessment & Plan Note (Signed)
Continue Plavix.  ?

## 2021-04-05 ENCOUNTER — Inpatient Hospital Stay (HOSPITAL_COMMUNITY): Payer: Medicare Other

## 2021-04-05 LAB — GASTROINTESTINAL PANEL BY PCR, STOOL (REPLACES STOOL CULTURE)

## 2021-04-05 LAB — PROCALCITONIN: Procalcitonin: <0.1 ng/mL

## 2021-04-05 MED ORDER — GUAIFENESIN-DM 100-10 MG/5ML PO SYRP
5.0000 mL | ORAL_SOLUTION | ORAL | Status: DC | PRN
  Administered 2021-04-05: 5 mL via ORAL
  Filled 2021-04-05: qty 10

## 2021-04-05 MED ORDER — LOPERAMIDE HCL 2 MG PO CAPS
2.0000 mg | ORAL_CAPSULE | Freq: Four times a day (QID) | ORAL | Status: DC | PRN
  Administered 2021-04-05: 2 mg via ORAL
  Filled 2021-04-05: qty 1

## 2021-04-05 MED ORDER — OXYCODONE HCL 5 MG PO TABS
5.0000 mg | ORAL_TABLET | ORAL | Status: DC | PRN
  Administered 2021-04-05: 5 mg via ORAL
  Filled 2021-04-05: qty 1

## 2021-04-05 NOTE — Plan of Care (Signed)

## 2021-04-05 NOTE — Progress Notes (Signed)
Nutrition Brief Note  Patient identified on the Malnutrition Screening Tool (MST) Report.  Wt Readings from Last 15 Encounters:  04/03/21 60.3 kg  12/27/20 57.2 kg  07/19/20 60.8 kg  04/12/20 63.8 kg  03/23/20 65.8 kg  03/15/20 65 kg    Body mass index is 24.72 kg/m. Patient meets criteria for normal weight based on current BMI. Skin WDL.   Current diet order is Regular. She ate 75% of breakfast and 100% of lunch today (total of 1074 kcal and 39 grams protein).  Ensure Plus High Protein ordered BID as part of ONS protocol, each supplement provides 350 kcal and 20 grams protein. She has accepted all bottles offered this admission.   She as admitted due to sepsis thought to be 2/2 influenza A.   Labs and medications reviewed.   No nutrition interventions warranted at this time. If nutrition issues arise, please consult RD.      Trenton Gammon, MS, RD, LDN, CNSC Inpatient Clinical Dietitian RD pager # available in AMION  After hours/weekend pager # available in [facility]

## 2021-04-05 NOTE — Progress Notes (Signed)
Patient reports having 3 bowel movements today, all type 7 and very watery.  No episodes of incontinence.  No complaints of nausea.    Complains of pain 8/10 in back and chest.  Per patient, has had this pain "a few times" in the past and has self medicated with daughter's Vicodin to relieve pain.  MD aware.  Bradd Burner, RN

## 2021-04-05 NOTE — TOC Initial Note (Signed)
Transition of Care [facility]) - Initial/Assessment Note    Patient Details  Name: Christy Heath MRN: [id number] Date of Birth: July 16, 1950  Transition of Care [facility]) CM/SW Contact:    Golda Acre, RN Phone Number: 04/05/2021, 11:51 AM  Clinical Narrative:                 70 y.o. female with history of peripheral vascular disease status post revascularization, hypertension, hyperlipidemia prior history of tobacco abuse presents to the ER because patient has been having flulike symptoms with upper respiratory tract symptoms headache diarrhea body aches.  Patient states she has chronic diarrhea but acutely worsened last 3 days and has been having multiple episodes of diarrhea today she also started experiencing nausea vomiting.  Denies any abdominal pain.  Has been having generalized weakness.  Patient's family members have turned out to be positive for COVID and also one of them was positive for flu.   ED Course: In the ER patient had a temperature of 100.4 F hypotensive with a blood pressure systolic in the 70s lactic acid was normal.  Patient was given sepsis protocol fluid bolus following which blood pressure improved.  Abdomen appears benign on exam.  Labs show creatinine 1.2 and bicarb of 19 sodium 131 WBC 10.4 influenza test was positive COVID test was negative.  Chest x-ray unremarkable UA unremarkable.  Patient admitted for sepsis secondary to influenza or diarrhea. TOC PLAN OF CARE: To return to home with spouse.  Patient is covid and influenza positive.  Progression: iv maxipime, iv flagyl and iv vancomycin. Expected Discharge Plan: Home/Self Care Barriers to Discharge: Continued Medical Work up   Patient Goals and CMS Choice Patient states their goals for this hospitalization and ongoing recovery are:: to go home CMS Medicare.gov Compare Post Acute Care list provided to:: Patient    Expected Discharge Plan and Services Expected Discharge Plan: Home/Self Care   Discharge  Planning Services: CM Consult   Living arrangements for the past 2 months: Single Family Home                                      Prior Living Arrangements/Services Living arrangements for the past 2 months: Single Family Home Lives with:: Spouse Patient language and need for interpreter reviewed:: Yes Do you feel safe going back to the place where you live?: Yes            Criminal Activity/Legal Involvement Pertinent to Current Situation/Hospitalization: No - Comment as needed  Activities of Daily Living Home Assistive Devices/Equipment: Dentures (specify type), Eyeglasses ADL Screening (condition at time of admission) Patient's cognitive ability adequate to safely complete daily activities?: Yes Is the patient deaf or have difficulty hearing?: No Does the patient have difficulty seeing, even when wearing glasses/contacts?: No Does the patient have difficulty concentrating, remembering, or making decisions?: Yes Patient able to express need for assistance with ADLs?: Yes Does the patient have difficulty dressing or bathing?: No Independently performs ADLs?: Yes (appropriate for developmental age) Communication: Independent Dressing (OT): Independent Grooming: Independent Feeding: Independent Bathing: Independent Toileting: Independent In/Out Bed: Independent Walks in Home: Independent Does the patient have difficulty walking or climbing stairs?: Yes Weakness of Legs: Both Weakness of Arms/Hands: Both  Permission Sought/Granted                  Emotional Assessment Appearance:: Appears stated age Attitude/Demeanor/Rapport: Engaged Affect (typically observed): Calm Orientation: :  Oriented to Place, Oriented to Self, Oriented to  Time, Oriented to Situation Alcohol / Substance Use: Not Applicable Psych Involvement: No (comment)  Admission diagnosis:  Influenza A [J10.1] Sepsis (HCC) [A41.9] Sepsis, due to unspecified organism, unspecified whether acute  organ dysfunction present [facility]) [A41.9] Patient Active Problem List   Diagnosis Date Noted   Sepsis (HCC) 04/03/2021   Diarrhea 04/03/2021   Essential hypertension 04/03/2021   PVD (peripheral vascular disease) (HCC) 04/03/2021   HLD (hyperlipidemia) 04/03/2021   Influenza A 04/03/2021   PCP:  Dois Davenport, MD Pharmacy:   [facility] [address], [place] - [address] [place] [address] Phone: [phone] Fax: [phone]     Social Determinants of Health (SDOH) Interventions    Readmission Risk Interventions No flowsheet data found.

## 2021-04-05 NOTE — Progress Notes (Signed)
PROGRESS NOTE    Christy Heath  TMH:[id number] DOB: 03-28-1951 DOA: 04/03/2021 PCP: Hayden Rasmussen, MD   Brief Narrative: Christy Heath is a 70 y.o. female with a history of PVD, hypertension, hyperlipidemia, tobacco use. Patient presented secondary to flu-like symptoms, diarrhea, body aches. She was found to be influenza A positive and started on Tamiflu. She also met sepsis criteria and had associated hypotension, responsive to fluid resuscitation. Blood and urine cultures obtained and are pending.    Assessment & Plan:   Principal Problem:   Sepsis ([place]) Active Problems:   Diarrhea   Essential hypertension   PVD (peripheral vascular disease) ([facility])   HLD (hyperlipidemia)   Influenza A   #1 diarrhea acute on chronic.Imodium as needed.  GI pathogen panel is pending.  A C. difficile was not sent as a suspicion was low.   #2 sepsis present on admission this was thought to be likely due to influenza.  However she was empirically started on vancomycin cefepime and Flagyl. Blood cultures negative so far Urine culture only with 4000 colonies of Enterococcus  #3 hyperlipidemia on Crestor  #4 peripheral vascular disease on Plavix  #5 essential hypertension on losartan hydrochlorothiazide and amlodipine.   Estimated body mass index is 24.72 kg/m as calculated from the following:   Height as of this encounter: 5' 1.5" (1.562 m).   Weight as of this encounter: 60.3 kg.  DVT prophylaxis: Lovenox  code Status full code Family Communication: None at bedside  disposition Plan:  Status is: Inpatient  Remains inpatient appropriate because: Due to persistent diarrhea   Consultants: None   Procedures: None Antimicrobials: Vanco cefepime and Flagyl  Subjective:  Patient continues to complain of diarrhea multiple stools overnight has history of chronic diarrhea but this is worse than her usual Complains of some chest discomfort which she cannot explain also having pleuritic  chest pain with cough Objective: Vitals:   04/04/21 1543 04/04/21 2002 04/05/21 0034 04/05/21 0610  BP: (!) 158/67 (!) 139/53 106/73 118/60  Pulse: 66 80 74 68  Resp: _0 Temp: 98.1 F (36.7 C) 98.9 F (37.2 C) 98.7 F (37.1 C) 98.4 F (36.9 C)  TempSrc: Oral Oral Oral Oral  SpO2: 100% 99% 100% 100%  Weight:      Height:        Intake/Output Summary (Last 24 hours) at 04/05/2021 1126 Last data filed at 04/05/2021 0929 Gross per 24 hour  Intake 717.5 ml  Output 250 ml  Net 467.5 ml   Filed Weights   04/03/21 1905  Weight: 60.3 kg    Examination:  General exam: Appears calm and comfortable  Respiratory system: Clear to auscultation. Respiratory effort normal. Cardiovascular system: S1 & S2 heard, RRR. No JVD, murmurs, rubs, gallops or clicks. No pedal edema. Gastrointestinal system: Abdomen is nondistended, soft and nontender. No organomegaly or masses felt. Normal bowel sounds heard. Central nervous system: Alert and oriented. No focal neurological deficits. Extremities: Symmetric 5 x 5 power. Skin: No rashes, lesions or ulcers Psychiatry: Judgement and insight appear normal. Mood & affect appropriate.     Data Reviewed: I have personally reviewed following labs and imaging studies  CBC: Recent Labs  Lab 04/03/21 2020 04/04/21 0257  WBC 10.4 6.2  NEUTROABS 8.5* 4.4  HGB 13.5 11.5*  HCT 39.7 34.6*  MCV 92.1 93.8  PLT 190 921   Basic Metabolic Panel: Recent Labs  Lab 04/03/21 2212 04/04/21 0257  NA 131* 134*  K 4.0 3.8  CL 103 105  CO2 19* 20*  GLUCOSE 136* 119*  BUN 20 16  CREATININE 1.24* 0.81  CALCIUM 8.1* 8.4*   GFR: Estimated Creatinine Clearance: 53 mL/min (by C-G formula based on SCr of 0.81 mg/dL). Liver Function Tests: Recent Labs  Lab 04/03/21 2212 04/04/21 0257  AST 19 20  ALT 14 13  ALKPHOS 59 63  BILITOT 0.8 0.6  PROT 6.4* 6.4*  ALBUMIN 3.4* 3.3*   No results for input(s): LIPASE, AMYLASE in the last 168  hours. No results for input(s): AMMONIA in the last 168 hours. Coagulation Profile: Recent Labs  Lab 04/03/21 2020  INR 0.9   Cardiac Enzymes: No results for input(s): CKTOTAL, CKMB, CKMBINDEX, TROPONINI in the last 168 hours. BNP (last 3 results) No results for input(s): PROBNP in the last 8760 hours. HbA1C: No results for input(s): HGBA1C in the last 72 hours. CBG: No results for input(s): GLUCAP in the last 168 hours. Lipid Profile: No results for input(s): CHOL, HDL, LDLCALC, TRIG, CHOLHDL, LDLDIRECT in the last 72 hours. Thyroid Function Tests: No results for input(s): TSH, T4TOTAL, FREET4, T3FREE, THYROIDAB in the last 72 hours. Anemia Panel: No results for input(s): VITAMINB12, FOLATE, FERRITIN, TIBC, IRON, RETICCTPCT in the last 72 hours. Sepsis Labs: Recent Labs  Lab 04/03/21 2020 04/05/21 0411  PROCALCITON  --  <0.10  LATICACIDVEN 1.2  --     Recent Results (from the past 240 hour(s))  Resp Panel by RT-PCR (Flu A&B, Covid) Nasopharyngeal Swab     Status: Abnormal   Collection Time: 04/03/21  8:06 PM   Specimen: Nasopharyngeal Swab; Nasopharyngeal(NP) swabs in vial transport medium  Result Value Ref Range Status   SARS Coronavirus 2 by RT PCR NEGATIVE NEGATIVE Final    Comment: (NOTE) SARS-CoV-2 target nucleic acids are NOT DETECTED.  The SARS-CoV-2 RNA is generally detectable in upper respiratory specimens during the acute phase of infection. The lowest concentration of SARS-CoV-2 viral copies this assay can detect is 138 copies/mL. A negative result does not preclude SARS-Cov-2 infection and should not be used as the sole basis for treatment or other patient management decisions. A negative result may occur with  improper specimen collection/handling, submission of specimen other than nasopharyngeal swab, presence of viral mutation(s) within the areas targeted by this assay, and inadequate number of viral copies(<138 copies/mL). A negative result must be  combined with clinical observations, patient history, and epidemiological information. The expected result is Negative.  Fact Sheet for Patients:  EntrepreneurPulse.com.au  Fact Sheet for Healthcare Providers:  IncredibleEmployment.be  This test is no t yet approved or cleared by the [place] FDA and  has been authorized for detection and/or diagnosis of SARS-CoV-2 by FDA under an Emergency Use Authorization (EUA). This EUA will remain  in effect (meaning this test can be used) for the duration of the COVID-19 declaration under Section 564(b)(1) of the Act, 21 U.S.C.section 360bbb-3(b)(1), unless the authorization is terminated  or revoked sooner.       Influenza A by PCR POSITIVE (A) NEGATIVE Final   Influenza B by PCR NEGATIVE NEGATIVE Final    Comment: (NOTE) The Xpert Xpress SARS-CoV-2/FLU/RSV plus assay is intended as an aid in the diagnosis of influenza from Nasopharyngeal swab specimens and should not be used as a sole basis for treatment. Nasal washings and aspirates are unacceptable for Xpert Xpress SARS-CoV-2/FLU/RSV testing.  Fact Sheet for Patients: EntrepreneurPulse.com.au  Fact Sheet for Healthcare Providers: IncredibleEmployment.be  This test is not yet approved or cleared by  the [employer] and has been authorized for detection and/or diagnosis of SARS-CoV-2 by FDA under an Emergency Use Authorization (EUA). This EUA will remain in effect (meaning this test can be used) for the duration of the COVID-19 declaration under Section 564(b)(1) of the Act, 21 U.S.C. section 360bbb-3(b)(1), unless the authorization is terminated or revoked.  Performed at [facility], [place] [address], [place], [place] [address]   Blood Culture (routine x 2)     Status: None (Preliminary result)   Collection Time: 04/03/21  8:20 PM   Specimen: BLOOD  Result Value Ref Range Status    Specimen Description   Final    BLOOD BLOOD LEFT HAND Performed at [place] [address], [place], [place] [address]    Special Requests   Final    BOTTLES DRAWN AEROBIC AND ANAEROBIC Blood Culture adequate volume Performed at [place] [address] [place] [address], [place], [place] [address]    Culture   Final    NO GROWTH 1 DAY Performed at [facility], [place] [address] [place], [place] [address]    Report Status PENDING  Incomplete  Blood Culture (routine x 2)     Status: None (Preliminary result)   Collection Time: 04/03/21  8:20 PM   Specimen: BLOOD  Result Value Ref Range Status   Specimen Description   Final    BLOOD BLOOD RIGHT HAND Performed at [place] [address] [place], [place] [address]    Special Requests   Final    BOTTLES DRAWN AEROBIC AND ANAEROBIC Blood Culture results may not be optimal due to an excessive volume of blood received in culture bottles Performed at [place] [address], [place], [place] [address]    Culture   Final    NO GROWTH 1 DAY Performed at [facility], [place] [address] [place], [place] [address]    Report Status PENDING  Incomplete  Urine Culture     Status: Abnormal (Preliminary result)   Collection Time: 04/03/21 11:00 PM   Specimen: In/Out Cath Urine  Result Value Ref Range Status   Specimen Description   Final    IN/OUT CATH URINE Performed at [place] [address] [place], [place] [address]    Special Requests   Final    NONE Performed at [facility], [place] [address], [place], [place] [address]    Culture (A)  Final    4,000 COLONIES/mL ENTEROCOCCUS FAECALIS SUSCEPTIBILITIES TO FOLLOW Performed at [facility], [place] [address] [place], [place] [address]    Report Status PENDING  Incomplete         Radiology Studies: DG Chest Port 1 View  Result Date:  04/03/2021 CLINICAL DATA:  Fever and body aches with flu-like symptoms. EXAM: PORTABLE CHEST 1 VIEW COMPARISON:  October 27, 2020 FINDINGS: The heart size and mediastinal contours are within normal limits. Mild calcification of the aortic arch is seen. Both lungs are clear. The visualized skeletal structures are unremarkable. IMPRESSION: No active disease. Electronically Signed   By: Virgina Norfolk M.D.   On: 04/03/2021 20:35        Scheduled Meds:  clopidogrel  75 mg Oral Daily   enoxaparin (LOVENOX) injection  40 mg Subcutaneous Q24H   feeding supplement  237 mL Oral BID BM   oseltamivir  30 mg Oral BID   rosuvastatin  20 mg Oral Daily  Continuous Infusions:  ceFEPime (MAXIPIME) IV 2 g (04/05/21 2550)   metronidazole 500 mg (04/05/21 1018)   vancomycin 1,000 mg (04/04/21 2116)     LOS: 1 day     Georgette Shell, MD

## 2021-04-06 LAB — URINE CULTURE: Culture: 4000 — AB

## 2021-04-06 LAB — PROCALCITONIN: Procalcitonin: <0.1 ng/mL

## 2021-04-06 MED ORDER — DICLOFENAC SODIUM 1 % EX GEL: 4.0000 g | Freq: Four times a day (QID) | CUTANEOUS | 1 refills | Status: DC

## 2021-04-06 MED ORDER — DICLOFENAC SODIUM 1 % EX GEL
4.0000 g | Freq: Four times a day (QID) | CUTANEOUS | Status: DC
  Administered 2021-04-06: 4 g via TOPICAL
  Filled 2021-04-06: qty 100

## 2021-04-06 MED ORDER — OSELTAMIVIR PHOSPHATE 30 MG PO CAPS: 30.0000 mg | ORAL_CAPSULE | Freq: Two times a day (BID) | ORAL | 0 refills | Status: AC

## 2021-04-06 NOTE — Progress Notes (Signed)
Discharge instructions reviewed with patient.  Patient verbally expressed understanding.  Prior Authorization form for Voltaren provided to patient and reviewed; patient verbalized understanding.  Cardiac monitoring and PIV removed.  Patient escorted to main entrance via wheelchair for transport home with daughter.  Bradd Burner, RN

## 2021-04-06 NOTE — Discharge Summary (Signed)
Physician Discharge Summary  Christy Heath ASN:[id number] DOB: Feb 26, 1951 DOA: 04/03/2021  PCP: Christy Rasmussen, MD  Admit date: 04/03/2021 Discharge date: 04/06/2021  Admitted From: home Disposition:  home  Recommendations for Outpatient Follow-up:  Follow up with PCP in 1-2 weeks Please obtain BMP/CBC in one week  Home Health:none Equipment/Devices:none  Discharge Condition:stable CODE STATUS:full Diet recommendation: cardiac Brief/Interim Summary: Christy Heath is a 70 y.o. female with a history of PVD, hypertension, hyperlipidemia, tobacco use. Patient presented secondary to flu-like symptoms, diarrhea, body aches. She was found to be influenza A positive and started on Tamiflu. She also met sepsis criteria and had associated hypotension, responsive to fluid resuscitation. Blood and urine cultures obtained.   Discharge Diagnoses:  Principal Problem:   Sepsis ([facility]) Active Problems:   Diarrhea   Essential hypertension   PVD (peripheral vascular disease) ([facility])   HLD (hyperlipidemia)   Influenza A   #1 diarrhea acute on chronic.Imodium as needed.  GI pathogen panel was negative.  A C. difficile was not sent as a suspicion was low.     #2 sepsis present on admission this was thought to be likely due to influenza.  However she was empirically started on vancomycin cefepime and Flagyl. Blood cultures remained negative.  Antibiotics were stopped.  Urine culture only with 4000 colonies of Enterococcus Patient also complained of pleuritic chest pain which is reproducible in the left upper back for which she was given diclofenac gel.   #3 hyperlipidemia on Crestor   #4 peripheral vascular disease on Plavix   #5 essential hypertension she was on Hyzaar and amlodipine prior to admission.  These were stopped at the time of discharge due to soft to normal blood pressure during the hospital stay.  Discussed with the patient to check her vital signs at home and restart as needed as  an outpatient after discussing with primary care physician.   Estimated body mass index is 24.72 kg/m as calculated from the following:   Height as of this encounter: 5' 1.5" (1.562 m).   Weight as of this encounter: 60.3 kg.  Discharge Instructions  Discharge Instructions     Diet - low sodium heart healthy   Complete by: As directed    Increase activity slowly   Complete by: As directed       Allergies as of 04/06/2021   No Known Allergies      Medication List     STOP taking these medications    amLODipine 5 MG tablet Commonly known as: NORVASC   losartan-hydrochlorothiazide 50-12.5 MG tablet Commonly known as: HYZAAR   ondansetron 8 MG disintegrating tablet Commonly known as: ZOFRAN-ODT       TAKE these medications    acetaminophen 325 MG tablet Commonly known as: TYLENOL Take 650 mg by mouth every 6 (six) hours as needed for fever or moderate pain.   Black Currant Seed Oil 500 MG Caps Take 1,000 mg by mouth daily.   clopidogrel 75 MG tablet Commonly known as: PLAVIX Take 1 tablet by mouth once daily   diclofenac Sodium 1 % Gel Commonly known as: VOLTAREN Apply 4 g topically 4 (four) times daily.   EYE SUPPORT PO Take 1 tablet by mouth daily. Eyesight Max   loperamide 2 MG capsule Commonly known as: IMODIUM Take 2 mg by mouth as needed for diarrhea or loose stools.   METHYLCELLULOSE OP Take 1,600 mg by mouth daily. Sea Moss dietary supplement/gel   oseltamivir 30 MG capsule Commonly known as: TAMIFLU Take  1 capsule (30 mg total) by mouth 2 (two) times daily for 5 doses.   rosuvastatin 20 MG tablet Commonly known as: CRESTOR Take 1 tablet (20 mg total) by mouth daily.   tolterodine 2 MG 24 hr capsule Commonly known as: DETROL LA Take 2 mg by mouth daily.   Vitamin D3 250 MCG (10000 UT) Tabs Take 10,000 Units by mouth every Saturday.        Follow-up Information     Christy Rasmussen, MD Follow up.   Specialty: Family  Medicine Contact information: [address] [place] [address] [phone]                No Known Allergies  Consultations: None   Procedures/Studies: DG Chest 1 View  Result Date: 04/05/2021 CLINICAL DATA:  Flu-like symptoms. EXAM: CHEST  1 VIEW COMPARISON:  Chest x-ray 04/03/2021. FINDINGS: The heart size and mediastinal contours are within normal limits. Both lungs are clear. The visualized skeletal structures are unremarkable. IMPRESSION: No active disease. Electronically Signed   By: Ronney Asters M.D.   On: 04/05/2021 16:40   DG Chest Port 1 View  Result Date: 04/03/2021 CLINICAL DATA:  Fever and body aches with flu-like symptoms. EXAM: PORTABLE CHEST 1 VIEW COMPARISON:  October 27, 2020 FINDINGS: The heart size and mediastinal contours are within normal limits. Mild calcification of the aortic arch is seen. Both lungs are clear. The visualized skeletal structures are unremarkable. IMPRESSION: No active disease. Electronically Signed   By: Virgina Norfolk M.D.   On: 04/03/2021 20:35   (Echo, Carotid, EGD, Colonoscopy, ERCP)    Subjective:  She is resting in bed anxious to go home diarrhea is better Discharge Exam: Vitals:   04/05/21 2139 04/06/21 0532  BP: (!) 112/55 129/62  Pulse: 68 66  Resp: 17 16  Temp: 98.7 F (37.1 C) 98.4 F (36.9 C)  SpO2: 94% 98%   Vitals:   04/05/21 0610 04/05/21 1318 04/05/21 2139 04/06/21 0532  BP: 118/60 114/71 (!) 112/55 129/62  Pulse: 68 73 68 66  Resp: 18 16 17 16   Temp: 98.4 F (36.9 C) 98.5 F (36.9 C) 98.7 F (37.1 C) 98.4 F (36.9 C)  TempSrc: Oral Oral Oral Oral  SpO2: 100% 100% 94% 98%  Weight:      Height:        General: Pt is alert, awake, not in acute distress Cardiovascular: RRR, S1/S2 +, no rubs, no gallops Respiratory: CTA bilaterally, no wheezing, no rhonchi Abdominal: Soft, NT, ND, bowel sounds + Extremities: no edema, no cyanosis    The results of significant diagnostics  from this hospitalization (including imaging, microbiology, ancillary and laboratory) are listed below for reference.     Microbiology: Recent Results (from the past 240 hour(s))  Resp Panel by RT-PCR (Flu A&B, Covid) Nasopharyngeal Swab     Status: Abnormal   Collection Time: 04/03/21  8:06 PM   Specimen: Nasopharyngeal Swab; Nasopharyngeal(NP) swabs in vial transport medium  Result Value Ref Range Status   SARS Coronavirus 2 by RT PCR NEGATIVE NEGATIVE Final    Comment: (NOTE) SARS-CoV-2 target nucleic acids are NOT DETECTED.  The SARS-CoV-2 RNA is generally detectable in upper respiratory specimens during the acute phase of infection. The lowest concentration of SARS-CoV-2 viral copies this assay can detect is 138 copies/mL. A negative result does not preclude SARS-Cov-2 infection and should not be used as the sole basis for treatment or other patient management decisions. A negative result may  occur with  improper specimen collection/handling, submission of specimen other than nasopharyngeal swab, presence of viral mutation(s) within the areas targeted by this assay, and inadequate number of viral copies(<138 copies/mL). A negative result must be combined with clinical observations, patient history, and epidemiological information. The expected result is Negative.  Fact Sheet for Patients:  EntrepreneurPulse.com.au  Fact Sheet for Healthcare Providers:  IncredibleEmployment.be  This test is no t yet approved or cleared by the [place] FDA and  has been authorized for detection and/or diagnosis of SARS-CoV-2 by FDA under an Emergency Use Authorization (EUA). This EUA will remain  in effect (meaning this test can be used) for the duration of the COVID-19 declaration under Section 564(b)(1) of the Act, 21 U.S.C.section 360bbb-3(b)(1), unless the authorization is terminated  or revoked sooner.       Influenza A by PCR POSITIVE (A)  NEGATIVE Final   Influenza B by PCR NEGATIVE NEGATIVE Final    Comment: (NOTE) The Xpert Xpress SARS-CoV-2/FLU/RSV plus assay is intended as an aid in the diagnosis of influenza from Nasopharyngeal swab specimens and should not be used as a sole basis for treatment. Nasal washings and aspirates are unacceptable for Xpert Xpress SARS-CoV-2/FLU/RSV testing.  Fact Sheet for Patients: EntrepreneurPulse.com.au  Fact Sheet for Healthcare Providers: IncredibleEmployment.be  This test is not yet approved or cleared by the [place] FDA and has been authorized for detection and/or diagnosis of SARS-CoV-2 by FDA under an Emergency Use Authorization (EUA). This EUA will remain in effect (meaning this test can be used) for the duration of the COVID-19 declaration under Section 564(b)(1) of the Act, 21 U.S.C. section 360bbb-3(b)(1), unless the authorization is terminated or revoked.  Performed at [facility], [place] [address] [place], [place] [address]   Blood Culture (routine x 2)     Status: None (Preliminary result)   Collection Time: 04/03/21  8:20 PM   Specimen: BLOOD  Result Value Ref Range Status   Specimen Description   Final    BLOOD BLOOD LEFT HAND Performed at [facility] [address], [place], [place] [address]    Special Requests   Final    BOTTLES DRAWN AEROBIC AND ANAEROBIC Blood Culture adequate volume Performed at [facility] [address] [place], [place] [address]    Culture   Final    NO GROWTH 2 DAYS Performed at [facility] [address], [place], [place] [address]    Report Status PENDING  Incomplete  Blood Culture (routine x 2)     Status: None (Preliminary result)   Collection Time: 04/03/21  8:20 PM   Specimen: BLOOD  Result Value Ref Range Status   Specimen Description   Final    BLOOD BLOOD RIGHT HAND Performed at [facility] [address] [place], [place] [address]    Special Requests   Final    BOTTLES DRAWN AEROBIC AND ANAEROBIC Blood Culture results may not be optimal due to an excessive volume of blood received in culture bottles Performed at [facility] [address], [place], [place] [address]    Culture   Final    NO GROWTH 2 DAYS Performed at [facility] [address], [place],  [address]    Report Status PENDING  Incomplete  Urine Culture     Status: Abnormal   Collection Time: 04/03/21 11:00 PM   Specimen: In/Out Cath Urine  Result Value Ref Range  Status   Specimen Description   Final    IN/OUT CATH URINE Performed at [facility] [address], [place], [place] [address]    Special Requests   Final    NONE Performed at [facility] [place], [place] [address] [place], [place] [address]    Culture 4,000 COLONIES/mL ENTEROCOCCUS FAECALIS (A)  Final   Report Status 04/06/2021 FINAL  Final   Organism ID, Bacteria ENTEROCOCCUS FAECALIS (A)  Final      Susceptibility   Enterococcus faecalis - MIC*    AMPICILLIN <=2 SENSITIVE Sensitive     NITROFURANTOIN <=16 SENSITIVE Sensitive     VANCOMYCIN 2 SENSITIVE Sensitive     * 4,000 COLONIES/mL ENTEROCOCCUS FAECALIS  Gastrointestinal Panel by PCR , Stool     Status: None   Collection Time: 04/04/21 12:42 AM   Specimen: Stool  Result Value Ref Range Status   Campylobacter species NOT DETECTED NOT DETECTED Final   Plesimonas shigelloides NOT DETECTED NOT DETECTED Final   Salmonella species NOT DETECTED NOT DETECTED Final   Yersinia enterocolitica NOT DETECTED NOT DETECTED Final   Vibrio species NOT DETECTED NOT DETECTED Final   Vibrio cholerae NOT DETECTED NOT DETECTED Final   Enteroaggregative E coli (EAEC) NOT DETECTED NOT DETECTED Final   Enteropathogenic E coli (EPEC) NOT DETECTED NOT DETECTED Final   Enterotoxigenic E coli (ETEC) NOT DETECTED NOT DETECTED Final    Shiga like toxin producing E coli (STEC) NOT DETECTED NOT DETECTED Final   Shigella/Enteroinvasive E coli (EIEC) NOT DETECTED NOT DETECTED Final   Cryptosporidium NOT DETECTED NOT DETECTED Final   Cyclospora cayetanensis NOT DETECTED NOT DETECTED Final   Entamoeba histolytica NOT DETECTED NOT DETECTED Final   Giardia lamblia NOT DETECTED NOT DETECTED Final   Adenovirus F40/41 NOT DETECTED NOT DETECTED Final   Astrovirus NOT DETECTED NOT DETECTED Final   Norovirus GI/GII NOT DETECTED NOT DETECTED Final   Rotavirus A NOT DETECTED NOT DETECTED Final   Sapovirus (I, II, IV, and V) NOT DETECTED NOT DETECTED Final    Comment: Performed at [facility], [address] [place], [place] [address]     Labs: BNP (last 3 results) No results for input(s): BNP in the last 8760 hours. Basic Metabolic Panel: Recent Labs  Lab 04/03/21 2212 04/04/21 0257  NA 131* 134*  K 4.0 3.8  CL 103 105  CO2 19* 20*  GLUCOSE 136* 119*  BUN 20 16  CREATININE 1.24* 0.81  CALCIUM 8.1* 8.4*   Liver Function Tests: Recent Labs  Lab 04/03/21 2212 04/04/21 0257  AST 19 20  ALT 14 13  ALKPHOS 59 63  BILITOT 0.8 0.6  PROT 6.4* 6.4*  ALBUMIN 3.4* 3.3*   No results for input(s): LIPASE, AMYLASE in the last 168 hours. No results for input(s): AMMONIA in the last 168 hours. CBC: Recent Labs  Lab 04/03/21 2020 04/04/21 0257  WBC 10.4 6.2  NEUTROABS 8.5* 4.4  HGB 13.5 11.5*  HCT 39.7 34.6*  MCV 92.1 93.8  PLT 190 174   Cardiac Enzymes: No results for input(s): CKTOTAL, CKMB, CKMBINDEX, TROPONINI in the last 168 hours. BNP: Invalid input(s): POCBNP CBG: No results for input(s): GLUCAP in the last 168 hours. D-Dimer No results for input(s): DDIMER in the last 72 hours. Hgb A1c No results for input(s): HGBA1C in the last 72 hours. Lipid Profile No results for input(s): CHOL, HDL, LDLCALC, TRIG, CHOLHDL, LDLDIRECT in the last 72 hours. Thyroid function studies No results for  input(s):  TSH, T4TOTAL, T3FREE, THYROIDAB in the last 72 hours.  Invalid input(s): FREET3 Anemia work up No results for input(s): VITAMINB12, FOLATE, FERRITIN, TIBC, IRON, RETICCTPCT in the last 72 hours. Urinalysis    Component Value Date/Time   COLORURINE STRAW (A) 04/03/2021 2300   APPEARANCEUR CLEAR 04/03/2021 2300   LABSPEC 1.006 04/03/2021 2300   PHURINE 5.0 04/03/2021 2300   GLUCOSEU NEGATIVE 04/03/2021 2300   HGBUR SMALL (A) 04/03/2021 2300   BILIRUBINUR NEGATIVE 04/03/2021 2300   KETONESUR NEGATIVE 04/03/2021 2300   PROTEINUR NEGATIVE 04/03/2021 2300   NITRITE NEGATIVE 04/03/2021 2300   LEUKOCYTESUR NEGATIVE 04/03/2021 2300   Sepsis Labs Invalid input(s): PROCALCITONIN,  WBC,  LACTICIDVEN Microbiology Recent Results (from the past 240 hour(s))  Resp Panel by RT-PCR (Flu A&B, Covid) Nasopharyngeal Swab     Status: Abnormal   Collection Time: 04/03/21  8:06 PM   Specimen: Nasopharyngeal Swab; Nasopharyngeal(NP) swabs in vial transport medium  Result Value Ref Range Status   SARS Coronavirus 2 by RT PCR NEGATIVE NEGATIVE Final    Comment: (NOTE) SARS-CoV-2 target nucleic acids are NOT DETECTED.  The SARS-CoV-2 RNA is generally detectable in upper respiratory specimens during the acute phase of infection. The lowest concentration of SARS-CoV-2 viral copies this assay can detect is 138 copies/mL. A negative result does not preclude SARS-Cov-2 infection and should not be used as the sole basis for treatment or other patient management decisions. A negative result may occur with  improper specimen collection/handling, submission of specimen other than nasopharyngeal swab, presence of viral mutation(s) within the areas targeted by this assay, and inadequate number of viral copies(<138 copies/mL). A negative result must be combined with clinical observations, patient history, and epidemiological information. The expected result is Negative.  Fact Sheet for Patients:   EntrepreneurPulse.com.au  Fact Sheet for Healthcare Providers:  IncredibleEmployment.be  This test is no t yet approved or cleared by the [place] FDA and  has been authorized for detection and/or diagnosis of SARS-CoV-2 by FDA under an Emergency Use Authorization (EUA). This EUA will remain  in effect (meaning this test can be used) for the duration of the COVID-19 declaration under Section 564(b)(1) of the Act, 21 U.S.C.section 360bbb-3(b)(1), unless the authorization is terminated  or revoked sooner.       Influenza A by PCR POSITIVE (A) NEGATIVE Final   Influenza B by PCR NEGATIVE NEGATIVE Final    Comment: (NOTE) The Xpert Xpress SARS-CoV-2/FLU/RSV plus assay is intended as an aid in the diagnosis of influenza from Nasopharyngeal swab specimens and should not be used as a sole basis for treatment. Nasal washings and aspirates are unacceptable for Xpert Xpress SARS-CoV-2/FLU/RSV testing.  Fact Sheet for Patients: EntrepreneurPulse.com.au  Fact Sheet for Healthcare Providers: IncredibleEmployment.be  This test is not yet approved or cleared by the [place] FDA and has been authorized for detection and/or diagnosis of SARS-CoV-2 by FDA under an Emergency Use Authorization (EUA). This EUA will remain in effect (meaning this test can be used) for the duration of the COVID-19 declaration under Section 564(b)(1) of the Act, 21 U.S.C. section 360bbb-3(b)(1), unless the authorization is terminated or revoked.  Performed at [facility], [place] [address] [place], [place] [address]   Blood Culture (routine x 2)     Status: None (Preliminary result)   Collection Time: 04/03/21  8:20 PM   Specimen: BLOOD  Result Value Ref Range Status   Specimen Description   Final    BLOOD BLOOD LEFT HAND  Performed at [facility], [place] [address], [place], [place] [address]     Special Requests   Final    BOTTLES DRAWN AEROBIC AND ANAEROBIC Blood Culture adequate volume Performed at [facility] [address] [place], [place] [address]    Culture   Final    NO GROWTH 2 DAYS Performed at [facility] [address] [place], [place] [address]    Report Status PENDING  Incomplete  Blood Culture (routine x 2)     Status: None (Preliminary result)   Collection Time: 04/03/21  8:20 PM   Specimen: BLOOD  Result Value Ref Range Status   Specimen Description   Final    BLOOD BLOOD RIGHT HAND Performed at [facility] [address] [place], [place] [address]    Special Requests   Final    BOTTLES DRAWN AEROBIC AND ANAEROBIC Blood Culture results may not be optimal due to an excessive volume of blood received in culture bottles Performed at [facility] [address] [place], [place] [address]    Culture   Final    NO GROWTH 2 DAYS Performed at [facility] [address], [place], [place] [address]    Report Status PENDING  Incomplete  Urine Culture     Status: Abnormal   Collection Time: 04/03/21 11:00 PM   Specimen: In/Out Cath Urine  Result Value Ref Range Status   Specimen Description   Final    IN/OUT CATH URINE Performed at [facility] [address] [place], [place] [address]    Special Requests   Final    NONE Performed at [facility], [place] [address], [place], [place] [address]    Culture 4,000 COLONIES/mL ENTEROCOCCUS FAECALIS (A)  Final   Report Status 04/06/2021 FINAL  Final   Organism ID, Bacteria ENTEROCOCCUS FAECALIS (A)  Final      Susceptibility   Enterococcus faecalis - MIC*    AMPICILLIN <=2 SENSITIVE Sensitive     NITROFURANTOIN <=16 SENSITIVE Sensitive     VANCOMYCIN 2 SENSITIVE Sensitive     * 4,000 COLONIES/mL ENTEROCOCCUS FAECALIS  Gastrointestinal Panel by PCR , Stool     Status: None   Collection Time:  04/04/21 12:42 AM   Specimen: Stool  Result Value Ref Range Status   Campylobacter species NOT DETECTED NOT DETECTED Final   Plesimonas shigelloides NOT DETECTED NOT DETECTED Final   Salmonella species NOT DETECTED NOT DETECTED Final   Yersinia enterocolitica NOT DETECTED NOT DETECTED Final   Vibrio species NOT DETECTED NOT DETECTED Final   Vibrio cholerae NOT DETECTED NOT DETECTED Final   Enteroaggregative E coli (EAEC) NOT DETECTED NOT DETECTED Final   Enteropathogenic E coli (EPEC) NOT DETECTED NOT DETECTED Final   Enterotoxigenic E coli (ETEC) NOT DETECTED NOT DETECTED Final   Shiga like toxin producing E coli (STEC) NOT DETECTED NOT DETECTED Final   Shigella/Enteroinvasive E coli (EIEC) NOT DETECTED NOT DETECTED Final   Cryptosporidium NOT DETECTED NOT DETECTED Final   Cyclospora cayetanensis NOT DETECTED NOT DETECTED Final   Entamoeba histolytica NOT DETECTED NOT DETECTED Final   Giardia lamblia NOT DETECTED NOT DETECTED Final   Adenovirus F40/41 NOT DETECTED NOT DETECTED Final   Astrovirus NOT DETECTED NOT DETECTED Final   Norovirus GI/GII NOT DETECTED NOT DETECTED Final   Rotavirus A NOT DETECTED NOT DETECTED Final   Sapovirus (I, II, IV, and V) NOT DETECTED NOT DETECTED  Final    Comment: Performed at [facility], [address], [place], [place] [address]     Time coordinating discharge: 39 minutes  SIGNED: Georgette Shell, MD  [facility] 04/06/2021, 3:13 PM

## 2021-04-06 NOTE — Progress Notes (Signed)
Spoke with patient about importance of staying active as preventative health and pain management.  Also recommended following up with PCP outpatient for back pain management.  Voltaren applied, Tylenol administered.  Patient agreeable to spending time in chair (patient has remained in bed while in hospital aside from bathrooming).  Bradd Burner, RN

## 2021-04-06 NOTE — Plan of Care (Signed)

## 2021-04-09 LAB — CULTURE, BLOOD (ROUTINE X 2)
Culture: NO GROWTH
Culture: NO GROWTH
Special Requests: ADEQUATE

## 2021-07-25 ENCOUNTER — Ambulatory Visit: Payer: Medicare Other | Admitting: Cardiovascular Disease

## 2021-07-31 ENCOUNTER — Encounter: Payer: Self-pay | Admitting: Cardiovascular Disease

## 2021-08-29 ENCOUNTER — Ambulatory Visit (INDEPENDENT_AMBULATORY_CARE_PROVIDER_SITE_OTHER): Payer: Medicare Other | Admitting: Cardiovascular Disease

## 2021-08-29 ENCOUNTER — Encounter: Payer: Self-pay | Admitting: Cardiovascular Disease

## 2021-08-29 VITALS — BP 140/76 | HR 68 | Ht 61.5 in | Wt 155.8 lb

## 2021-08-29 DIAGNOSIS — I7143 Infrarenal abdominal aortic aneurysm, without rupture: Secondary | ICD-10-CM | POA: Diagnosis not present

## 2021-08-29 DIAGNOSIS — I10 Essential (primary) hypertension: Secondary | ICD-10-CM | POA: Diagnosis not present

## 2021-08-29 DIAGNOSIS — I739 Peripheral vascular disease, unspecified: Secondary | ICD-10-CM | POA: Diagnosis not present

## 2021-08-29 DIAGNOSIS — E785 Hyperlipidemia, unspecified: Secondary | ICD-10-CM

## 2021-08-29 MED ORDER — CLOPIDOGREL BISULFATE 75 MG PO TABS: 75.0000 mg | ORAL_TABLET | Freq: Every day | ORAL | 5 refills | Status: DC

## 2021-08-29 MED ORDER — ROSUVASTATIN CALCIUM 20 MG PO TABS: 20.0000 mg | ORAL_TABLET | Freq: Every day | ORAL | 3 refills | Status: AC

## 2021-08-29 NOTE — Progress Notes (Signed)
?  ?Cardiology Office Note ? ? ?Date:  08/29/2021  ? ?ID:  Christy Heath, DOB [birth year], MRN [id number] ? ?PCP:  Dois Davenport, MD  ?Cardiologist:   Lorine Bears, MD  ? ?No chief complaint on file. ? ? ? ?  ?History of Present Illness: ?Christy Heath is a 71 y.o. female who is here today for follow-up visit regarding peripheral arterial disease.  ?She has known history of hyperlipidemia, essential hypertension, COPD and previous tobacco use.  She quit smoking in 2021 but she smoked 1 pack/day for more than 50 years.   ?She was seen in late 2021 for severe bilateral leg claudication worse on the left side.  Angiography was done in November of 2021 which showed small infrarenal abdominal aortic aneurysm. on the right side, there was mild iliac disease, borderline significant common femoral artery stenosis, severe proximal SFA stenosis with medium length occlusion in the distal SFA with reconstitution via collaterals in the proximal segment of the popliteal artery with three-vessel runoff below the knee.  On the left side, there was severe stenosis in the common iliac artery, severe proximal SFA disease, severe calcified proximal popliteal artery stenosis and three-vessel runoff below the knee.  I performed successful drug-coated balloon angioplasty to the left common iliac artery as well as orbital atherectomy and drug-coated balloon angioplasty to the left SFA and popliteal artery.   ?Most recent noninvasive vascular studies done in July of 2022 showed an ABI of 0.73 on the right and 0.88 on the left.  Duplex showed stable size abdominal aortic aneurysm at 3.3 cm with patent left iliac artery and patent left popliteal artery. ? ?Unfortunately, she stopped taking her cardiac medications and switch to holistic medications.  She went on a trip to [place] earlier this month and noticed bilateral leg pain with walking worse on the left side.  The pain on the left side seems similar to her prior claudication but is  more localized on the lateral side of the calf with difficulty putting weight on her left leg.  No chest pain or shortness of breath. ? ? ?Past Medical History:  ?Diagnosis Date  ? Hyperlipidemia   ? Hypertension   ? ? ? ? ? ?Current Outpatient Medications  ?Medication Sig Dispense Refill  ? Black Currant Seed Oil 500 MG CAPS Take 1,000 mg by mouth daily.    ? Cholecalciferol (VITAMIN D3) 250 MCG (10000 UT) TABS Take 10,000 Units by mouth every Saturday.    ? diclofenac Sodium (VOLTAREN) 1 % GEL Apply 4 g topically 4 (four) times daily. 4 g 1  ? METHYLCELLULOSE OP Take 1,600 mg by mouth daily. Sea Moss dietary supplement/gel    ? Multiple Vitamins-Minerals (EYE SUPPORT PO) Take 1 tablet by mouth daily. Eyesight Max    ? acetaminophen (TYLENOL) 325 MG tablet Take 650 mg by mouth every 6 (six) hours as needed for fever or moderate pain. (Patient not taking: Reported on 08/29/2021)    ? clopidogrel (PLAVIX) 75 MG tablet Take 1 tablet by mouth once daily (Patient not taking: Reported on 08/29/2021) 30 tablet 0  ? loperamide (IMODIUM) 2 MG capsule Take 2 mg by mouth as needed for diarrhea or loose stools. (Patient not taking: Reported on 08/29/2021)    ? rosuvastatin (CRESTOR) 20 MG tablet Take 1 tablet (20 mg total) by mouth daily. (Patient not taking: Reported on 08/29/2021) 90 tablet 3  ? tolterodine (DETROL LA) 2 MG 24 hr capsule Take 2 mg by mouth daily. (Patient not  taking: Reported on 08/29/2021)    ? ?No current facility-administered medications for this visit.  ? ? ?Allergies:   Patient has no known allergies.  ? ? ?Social History:  The patient  reports that she has quit smoking. Her smoking use included cigarettes. She has never used smokeless tobacco. She reports current alcohol use. She reports that she does not currently use drugs after having used the following drugs: Marijuana.  ? ?Family History:  The patient's family history is negative for peripheral arterial disease or coronary artery disease. ? ? ?ROS:   Please see the history of present illness.   Otherwise, review of systems are positive for none.   All other systems are reviewed and negative.  ? ? ?PHYSICAL EXAM: ?VS:  BP 140/76   Pulse 68   Ht 5' 1.5" (1.562 m)   Wt 155 lb 12.8 oz (70.7 kg)   SpO2 98%   BMI 28.96 kg/m?  , BMI Body mass index is 28.96 kg/m?. ?GEN: Well nourished, well developed, in no acute distress  ?HEENT: normal  ?Neck: no JVD, carotid bruits, or masses ?Cardiac: RRR; no  rubs, or gallops,no edema .  1 /6 systolic murmur in the aortic area ?Respiratory:  clear to auscultation bilaterally, normal work of breathing ?GI: soft, nontender, nondistended, + BS ?MS: no deformity or atrophy  ?Skin: warm and dry, no rash ?Neuro:  Strength and sensation are intact ?Psych: euthymic mood, full affect ?Vascular: Radial pulses normal bilaterally.  Dorsalis pedis is palpable on the left side but not the right side. ? ? ?EKG:  EKG is  not ordered today. ? ? ? ?Recent Labs: ?04/04/2021: ALT 13; BUN 16; Creatinine, Ser 0.81; Hemoglobin 11.5; Platelets 174; Potassium 3.8; Sodium 134  ? ? ?Lipid Panel ?   ?Component Value Date/Time  ? CHOL 135 12/27/2020 0939  ? TRIG 95 12/27/2020 0939  ? HDL 63 12/27/2020 0939  ? CHOLHDL 2.1 12/27/2020 0939  ? LDLCALC 54 12/27/2020 0939  ? ?  ? ?Wt Readings from Last 3 Encounters:  ?08/29/21 155 lb 12.8 oz (70.7 kg)  ?04/03/21 133 lb (60.3 kg)  ?12/27/20 126 lb (57.2 kg)  ?  ? ? ? ? ?   ? View : No data to display.  ?  ?  ?  ? ? ? ? ?ASSESSMENT AND PLAN: ? ?1.  Peripheral arterial disease .  Status post revascularization of the left lower extremity.  She does have residual disease affecting the right lower extremity including moderate common femoral artery disease and occluded distal SFA.  She reports recurrent left calf claudication but she has a palpable dorsalis pedis pulse.  Most of the symptoms are on the lateral side of the left calf which is not the usual location for claudication.  Look we could be looking at the  different etiology.  Given her recurrent symptoms, I am going to repeat her ABI and duplex. ?Unfortunately, she stopped taking clopidogrel and rosuvastatin I discussed with her the importance of medical therapy for peripheral arterial disease.  I elected to resume clopidogrel today. ? ?2.  Hyperlipidemia: I discussed the importance of statin therapy and peripheral arterial disease and resumed rosuvastatin 20 mg daily. ? ?3.  Previous tobacco use: No relapse. ? ?4.  Essential hypertension: She is no longer on amlodipine but blood pressure is reasonable. ? ?5.  Abdominal aortic aneurysm: 3.3 cm in diameter.  This will be monitored on a yearly basis. ? ? ? ?Disposition:   FU with me  in 6 months ? ?Signed, ? ?Lorine BearsMuhammad Alvira Hecht, MD  ?08/29/2021 8:36 AM    ?[facility] ?

## 2021-08-29 NOTE — Patient Instructions (Addendum)
Medication Instructions:  ?START: PLAVIX 75mg  ONCE DAILY  ?START: CRESTOR (ROSUVASTATIN) 20mg  ONCE DAILY  ?*If you need a refill on your cardiac medications before your next appointment, please call your pharmacy* ? ?Testing/Procedures: ?Your physician has requested that you have an ankle brachial index (ABI) PLEASE SCHEDULE NEXT AVAILABLE. During this test an ultrasound and blood pressure cuff are used to evaluate the arteries that supply the arms and legs with blood. Allow thirty minutes for this exam. There are no restrictions or special instructions. This will take place at [address] [facility], [address].  ? ?Your physician has requested that you have a lower extremity arterial duplex PLEASE SCHEDULE NEXT AVAILABLE. During this test, ultrasound is used to evaluate arterial blood flow in the legs. Allow one hour for this exam. There are no restrictions or special instructions. This will take place at [address] [facility], [address]. ? ?Follow-Up: ?At [facility], you and your health needs are our priority.  As part of our continuing mission to provide you with exceptional heart care, we have created designated Provider Care Teams.  These Care Teams include your primary Cardiologist (physician) and Advanced Practice Providers (APPs -  Physician Assistants and Nurse Practitioners) who all work together to provide you with the care you need, when you need it. ? ?Your next appointment:   ?6 month(s) ? ?The format for your next appointment:   ?In Person ? ?Provider:  ?[facility]  ?

## 2021-09-10 ENCOUNTER — Other Ambulatory Visit: Payer: Self-pay

## 2021-09-10 ENCOUNTER — Emergency Department (HOSPITAL_COMMUNITY)
Admission: EM | Admit: 2021-09-10 | Discharge: 2021-09-10 | Disposition: A | Discharge status: Home / Self Care | Payer: Medicare Other | Attending: Emergency Medicine | Admitting: Emergency Medicine

## 2021-09-10 ENCOUNTER — Emergency Department (HOSPITAL_BASED_OUTPATIENT_CLINIC_OR_DEPARTMENT_OTHER): Payer: Medicare Other

## 2021-09-10 DIAGNOSIS — M79602 Pain in left arm: Secondary | ICD-10-CM | POA: Insufficient documentation

## 2021-09-10 DIAGNOSIS — M542 Cervicalgia: Secondary | ICD-10-CM | POA: Diagnosis present

## 2021-09-10 DIAGNOSIS — M5412 Radiculopathy, cervical region: Secondary | ICD-10-CM | POA: Diagnosis not present

## 2021-09-10 DIAGNOSIS — M25512 Pain in left shoulder: Secondary | ICD-10-CM | POA: Diagnosis not present

## 2021-09-10 DIAGNOSIS — M79622 Pain in left upper arm: Secondary | ICD-10-CM | POA: Diagnosis not present

## 2021-09-10 DIAGNOSIS — Z7902 Long term (current) use of antithrombotics/antiplatelets: Secondary | ICD-10-CM | POA: Insufficient documentation

## 2021-09-10 DIAGNOSIS — Z79899 Other long term (current) drug therapy: Secondary | ICD-10-CM | POA: Diagnosis not present

## 2021-09-10 LAB — CBC
HCT: 43.1 % (ref 36.0–46.0)
Hemoglobin: 14.3 g/dL (ref 12.0–15.0)
MCH: 30.5 pg (ref 26.0–34.0)
MCHC: 33.2 g/dL (ref 30.0–36.0)
MCV: 91.9 fL (ref 80.0–100.0)
Platelets: 290 10*3/uL (ref 150–400)
RBC: 4.69 MIL/uL (ref 3.87–5.11)
RDW: 14.7 % (ref 11.5–15.5)
WBC: 14.3 10*3/uL — ABNORMAL HIGH (ref 4.0–10.5)
nRBC: 0 % (ref 0.0–0.2)

## 2021-09-10 LAB — BASIC METABOLIC PANEL
Anion gap: 7 (ref 5–15)
BUN: 23 mg/dL (ref 8–23)
CO2: 22 mmol/L (ref 22–32)
Calcium: 9.5 mg/dL (ref 8.9–10.3)
Chloride: 109 mmol/L (ref 98–111)
Creatinine, Ser: 0.75 mg/dL (ref 0.44–1.00)
GFR, Estimated: >60 mL/min (ref >60)
Glucose, Bld: 123 mg/dL — ABNORMAL HIGH (ref 70–99)
Potassium: 4 mmol/L (ref 3.5–5.1)
Sodium: 138 mmol/L (ref 135–145)

## 2021-09-10 MED ORDER — HYDROCODONE-ACETAMINOPHEN 5-325 MG PO TABS
1.0000 | ORAL_TABLET | ORAL | 0 refills | Status: DC | PRN
  Filled 2021-09-10: qty 12, 1d supply, fill #0

## 2021-09-10 MED ORDER — MORPHINE SULFATE (PF) 4 MG/ML IV SOLN
4.0000 mg | Freq: Once | INTRAVENOUS | Status: AC
  Administered 2021-09-10: 4 mg via INTRAMUSCULAR
  Filled 2021-09-10: qty 1

## 2021-09-10 NOTE — ED Provider Triage Note (Signed)
Emergency Medicine Provider Triage Evaluation Note ? ?Fransisca Connors , a 71 y.o. female  was evaluated in triage.  Pt complains of left upper extremity pain since traveling to [place] a week ago.  Patient has history of blood clots, states she is anticoagulated.  Patient complaining of pain extending from neck down into her fingers. ? ?Review of Systems  ?Positive:  ?Negative:  ? ?Physical Exam  ?BP (!) 151/90 (BP Location: Left Arm)   Pulse 81   Temp 98 ?F (36.7 ?C) (Oral)   Resp 18   Ht 5\' 1"  (1.549 m)   Wt 69.4 kg   SpO2 98%   BMI 28.91 kg/m?  ?Gen:   Awake, no distress   ?Resp:  Normal effort  ?MSK:   Moves extremities without difficulty  ?Other:  2+ radial pulse to left side ? ?Medical Decision Making  ?Medically screening exam initiated at 2:59 PM.  Appropriate orders placed.  Jenene Bartz was informed that the remainder of the evaluation will be completed by another provider, this initial triage assessment does not replace that evaluation, and the importance of remaining in the ED until their evaluation is complete. ? ? ?  ?Azucena Cecil, PA-C ?09/10/21 1459 ? ?

## 2021-09-10 NOTE — Discharge Instructions (Addendum)
Follow-up with your orthopedic surgeon as discussed.  Return to the emergency if you have any worsening symptoms. ?

## 2021-09-10 NOTE — ED Provider Notes (Signed)
?[facility]-EMERGENCY DEPT ?Provider Note ? ? ?CSN: [id number] ?Arrival date & time: 09/10/21  1435 ? ?  ? ?History ? ?Chief Complaint  ?Patient presents with  ? Extremity Pain  ? ? ?Christy Heath is a 71 y.o. female. ? ?Patient is a 71 year old female who presents with neck and arm pain.  She said has been going on for about a week.  It starts in her lower neck and radiates down her left arm.  She has some intermittent tingling in the hand.  No weakness in the arm.  No swelling of the arm.  She does have a prior history of blood clots.  She also said she had similar pain about 6 months ago and saw Dr. Shon Baton with orthopedics.  She had an MRI which showed a pinched nerve.  She said it ultimately went away without further intervention.  It came back again 1 week ago.  She saw her PCP and was prescribed gabapentin and a course of prednisone which has not been helping. ? ? ?  ? ?Home Medications ?Prior to Admission medications   ?Medication Sig Start Date End Date Taking? Authorizing Provider  ?HYDROcodone-acetaminophen (NORCO/VICODIN) 5-325 MG tablet Take 1-2 tablets by mouth every 4 (four) hours as needed. 09/10/21  Yes Rolan Bucco, MD  ?acetaminophen (TYLENOL) 325 MG tablet Take 650 mg by mouth every 6 (six) hours as needed for fever or moderate pain. ?Patient not taking: Reported on 08/29/2021    [provider]  ?Black Currant Seed Oil 500 MG CAPS Take 1,000 mg by mouth daily.    [provider]  ?Cholecalciferol (VITAMIN D3) 250 MCG (10000 UT) TABS Take 10,000 Units by mouth every Saturday.    [provider]  ?clopidogrel (PLAVIX) 75 MG tablet Take 1 tablet (75 mg total) by mouth daily. 08/29/21   Iran Ouch, MD  ?diclofenac Sodium (VOLTAREN) 1 % GEL Apply 4 g topically 4 (four) times daily. 04/06/21   Alwyn Ren, MD  ?loperamide (IMODIUM) 2 MG capsule Take 2 mg by mouth as needed for diarrhea or loose stools. ?Patient not taking: Reported on 08/29/2021     [provider]  ?METHYLCELLULOSE OP Take 1,600 mg by mouth daily. Sea Moss dietary supplement/gel    [provider]  ?Multiple Vitamins-Minerals (EYE SUPPORT PO) Take 1 tablet by mouth daily. Eyesight Max    [provider]  ?rosuvastatin (CRESTOR) 20 MG tablet Take 1 tablet (20 mg total) by mouth daily. 08/29/21   Iran Ouch, MD  ?tolterodine (DETROL LA) 2 MG 24 hr capsule Take 2 mg by mouth daily. ?Patient not taking: Reported on 08/29/2021 02/09/20   [provider]  ?   ? ?Allergies    ?Patient has no known allergies.   ? ?Review of Systems   ?Review of Systems  ?Constitutional:  Negative for fever.  ?Gastrointestinal:  Negative for nausea and vomiting.  ?Musculoskeletal:  Positive for myalgias and neck pain. Negative for arthralgias, back pain and joint swelling.  ?Skin:  Negative for wound.  ?Neurological:  Negative for weakness, numbness and headaches.  ? ?Physical Exam ?Updated Vital Signs ?BP (!) 150/80   Pulse 66   Temp 98 ?F (36.7 ?C) (Oral)   Resp 18   Ht 5\' 1"  (1.549 m)   Wt 69.4 kg   SpO2 99%   BMI 28.91 kg/m?  ?Physical Exam ?Constitutional:   ?   Appearance: She is well-developed.  ?HENT:  ?   Head: Normocephalic  and atraumatic.  ?Cardiovascular:  ?   Rate and Rhythm: Normal rate.  ?Pulmonary:  ?   Effort: Pulmonary effort is normal.  ?Musculoskeletal:     ?   General: Tenderness present.  ?   Cervical back: Normal range of motion and neck supple.  ?   Comments:  positive tenderness in the left paraspinal area and along the left trapezius muscle.  Radial pulses are intact.  No swelling of the arm.  She has normal sensation in all nerve distributions of the hand.  Motor 5 out of 5 both upper extremities.  ?Skin: ?   General: Skin is warm and dry.  ?Neurological:  ?   Mental Status: She is alert and oriented to person, place, and time.  ? ? ?ED Results / Procedures / Treatments   ?Labs ?(all labs ordered are listed, but only abnormal results are  displayed) ?Labs Reviewed  ?CBC - Abnormal; Notable for the following components:  ?    Result Value  ? WBC 14.3 (*)   ? All other components within normal limits  ?BASIC METABOLIC PANEL - Abnormal; Notable for the following components:  ? Glucose, Bld 123 (*)   ? All other components within normal limits  ? ? ?EKG ?None ? ?Radiology ?UE VENOUS DUPLEX (7am - 7pm) ? ?Result Date: 09/10/2021 ?UPPER VENOUS STUDY  Patient Name:  Christy Heath  Date of Exam:   09/10/2021 Medical Rec #: [id number]       Accession #:    [id number] Date of Birth: 1950/08/22       Patient Gender: F Patient Age:   11 years Exam Location:  [facility] Procedure:      VAS [place] UPPER EXTREMITY VENOUS DUPLEX Referring Phys: Cristal Deer GROCE --------------------------------------------------------------------------------  Indications: Pain radiating from left back/neck down shoulder and arm Comparison Study: No prior studies. Performing Technologist: Jean Rosenthal RDMS, RVT  Examination Guidelines: A complete evaluation includes B-mode imaging, spectral Doppler, color Doppler, and power Doppler as needed of all accessible portions of each vessel. Bilateral testing is considered an integral part of a complete examination. Limited examinations for reoccurring indications may be performed as noted.  Right Findings: +----------+------------+---------+-----------+----------+-------+ RIGHT     CompressiblePhasicitySpontaneousPropertiesSummary +----------+------------+---------+-----------+----------+-------+ Subclavian               Yes       Yes                      +----------+------------+---------+-----------+----------+-------+  Left Findings: +----------+------------+---------+-----------+----------+-------+ LEFT      CompressiblePhasicitySpontaneousPropertiesSummary +----------+------------+---------+-----------+----------+-------+ IJV           Full       Yes       Yes                       +----------+------------+---------+-----------+----------+-------+ Subclavian    Full       Yes       Yes                      +----------+------------+---------+-----------+----------+-------+ Axillary      Full       Yes       Yes                      +----------+------------+---------+-----------+----------+-------+ Brachial      Full                                          +----------+------------+---------+-----------+----------+-------+  Radial        Full                                          +----------+------------+---------+-----------+----------+-------+ Ulnar         Full                                          +----------+------------+---------+-----------+----------+-------+ Cephalic      Full                                          +----------+------------+---------+-----------+----------+-------+ Basilic       Full                                          +----------+------------+---------+-----------+----------+-------+  Summary:  Right: No evidence of thrombosis in the subclavian.  Left: No evidence of deep vein thrombosis in the upper extremity. No evidence of superficial vein thrombosis in the upper extremity.  *See table(s) above for measurements and observations.    Preliminary    ? ?Procedures ?Procedures  ? ? ?Medications Ordered in ED ?Medications  ?morphine (PF) 4 MG/ML injection 4 mg (has no administration in time range)  ? ? ?ED Course/ Medical Decision Making/ A&P ?  ?                        ?Medical Decision Making ?Risk ?Prescription drug management. ? ? ?Patient had a vascular ultrasound of the arm which showed no evidence of DVT.  Her labs show mild elevation in WBC count but otherwise unremarkable.  She does not have any infectious symptoms.  This is likely a cervical radiculopathy.  She does not have any neurologic deficits.  She was given a shot of morphine.  She will be given a short course of hydrocodone.  She just finished a  prednisone course so I did not feel that it be prudent to redo this.  I did encourage her to have follow-up with Dr. Shon BatonBrooks.  Return precautions were given. ? ?Final Clinical Impression(s) / ED Diagnoses ?Final diagnoses:  ?Cervical radicular pain  ? ? ?Rx / DC Orders ?ED Discharge Orders   ? ?      Ordered  ?  H

## 2021-09-10 NOTE — ED Triage Notes (Signed)
Pt states she is having left arm pain. States this pain radiates from her neck. States this pain started last month after a trip to [place]. Denies injury. ?

## 2021-09-10 NOTE — Progress Notes (Signed)
Upper extremity venous LT study completed. ? ?Preliminary results relayed to [place], [place]. ? ?See CV Proc for preliminary results report.  ? ?Jean Rosenthal, RDMS, RVT ? ?

## 2021-09-11 ENCOUNTER — Other Ambulatory Visit (HOSPITAL_COMMUNITY): Payer: Self-pay

## 2021-09-20 ENCOUNTER — Ambulatory Visit (HOSPITAL_COMMUNITY)
Admission: RE | Admit: 2021-09-20 | Discharge: 2021-09-20 | Disposition: A | Discharge status: Home / Self Care | Payer: Medicare Other | Source: Ambulatory Visit | Attending: Cardiology | Admitting: Cardiology

## 2021-09-20 DIAGNOSIS — I739 Peripheral vascular disease, unspecified: Secondary | ICD-10-CM | POA: Insufficient documentation

## 2021-11-01 ENCOUNTER — Telehealth: Payer: Self-pay

## 2021-11-01 NOTE — Telephone Encounter (Signed)
   Pre-operative Risk Assessment    Patient Name: Christy Heath  DOB: Jan 18, 1951 MRN: [id number]      Request for Surgical Clearance    Procedure:   ACDF C4-7  Date of Surgery:  Clearance TBD                                 Surgeon:  Dr. Venita Lick Surgeon's Group or Practice Name:  [facility] Phone number:  [phone] Fax number:  [phone] ATTN: Rosalva Ferron   Type of Clearance Requested:   - Medical  - Pharmacy:  Hold Clopidogrel (Plavix)     Type of Anesthesia:  General    Additional requests/questions:   Done  Signed, Kingsley Farace   11/01/2021, 5:14 PM

## 2021-11-02 ENCOUNTER — Ambulatory Visit: Payer: Self-pay | Admitting: Orthopedic Surgery

## 2021-11-02 NOTE — Telephone Encounter (Signed)
Spoke with pt and she is and has been taking her Plavix.

## 2021-11-03 NOTE — Telephone Encounter (Signed)
Stop Plavix 5 days before.

## 2021-11-03 NOTE — Telephone Encounter (Signed)
Pt agreeable to plan of care for tele pre op appt 11/13/21 @ 2 pm. Med rec and consent are done. Pt tells me that her surgery is set for 12/07/21.

## 2021-11-03 NOTE — Telephone Encounter (Signed)
   Name: Christy Heath  DOB: October 22, 1950  MRN: [id number]  Primary Cardiologist: None  Chart reviewed as part of pre-operative protocol coverage. Because of Elsie Baynes past medical history and time since last visit, she will require a follow-up tele visit in order to better assess preoperative cardiovascular risk.  Pre-op covering staff: - Please schedule appointment and call patient to inform them. If patient already had an upcoming appointment within acceptable timeframe, please add "pre-op clearance" to the appointment notes so provider is aware. - Please contact requesting surgeon's office via preferred method (i.e, phone, fax) to inform them of need for appointment prior to surgery.  Per MD, okay to hold Plavix x5 days prior to procedure.  Sharlene Dory, PA-C  11/03/2021, 9:47 AM

## 2021-11-09 ENCOUNTER — Other Ambulatory Visit (HOSPITAL_COMMUNITY): Payer: Self-pay | Admitting: Cardiovascular Disease

## 2021-11-09 DIAGNOSIS — I714 Abdominal aortic aneurysm, without rupture, unspecified: Secondary | ICD-10-CM

## 2021-11-13 ENCOUNTER — Encounter: Payer: Self-pay | Admitting: Nurse Practitioner

## 2021-11-13 ENCOUNTER — Ambulatory Visit (INDEPENDENT_AMBULATORY_CARE_PROVIDER_SITE_OTHER): Payer: Medicare Other | Admitting: Nurse Practitioner

## 2021-11-13 DIAGNOSIS — Z01810 Encounter for preprocedural cardiovascular examination: Secondary | ICD-10-CM

## 2021-11-13 NOTE — Progress Notes (Signed)
Virtual Visit via Telephone Note   Because of Michalle Rademaker co-morbid illnesses, she is at least at moderate risk for complications without adequate follow up.  This format is felt to be most appropriate for this patient at this time.  The patient did not have access to video technology/had technical difficulties with video requiring transitioning to audio format only (telephone).  All issues noted in this document were discussed and addressed.  No physical exam could be performed with this format.  Please refer to the patient's chart for her consent to telehealth for [facility].  Evaluation Performed:  Preoperative cardiovascular risk assessment _____________   Date:  11/13/2021   Patient ID:  Christy Heath, DOB [birth year], MRN [id number] Patient Location:  Home Provider location:   Office  Primary Care Provider:  Dois Davenport, MD Primary Cardiologist:  Lorine Bears, MD  Chief Complaint / Patient Profile   71 y.o. y/o female with a h/o PAD, hyperlipidemia, hypertension, COPD, and previous tobacco use who is pending ACDF C4-7 and presents today for telephonic preoperative cardiovascular risk assessment.  Past Medical History    Past Medical History:  Diagnosis Date   Hyperlipidemia    Hypertension    Past Surgical History:  Procedure Laterality Date   ABDOMINAL AORTOGRAM W/LOWER EXTREMITY N/A 03/23/2020   Procedure: ABDOMINAL AORTOGRAM W/LOWER EXTREMITY;  Surgeon: Iran Ouch, MD;  [facility]: [facility] INV[facility];  Service: Cardiovascular;  Laterality: N/A;   PERIPHERAL VASCULAR ATHERECTOMY Left 03/23/2020   Procedure: PERIPHERAL VASCULAR ATHERECTOMY;  Surgeon: Iran Ouch, MD;  Location: [facility] INV[facility] CV [facility];  Service: Cardiovascular;  Laterality: Left;   PERIPHERAL VASCULAR BALLOON ANGIOPLASTY Left 03/23/2020   Procedure: PERIPHERAL VASCULAR BALLOON ANGIOPLASTY;  Surgeon: Iran Ouch, MD;  [facility]: [facility];  Service: Cardiovascular;   Laterality: Left;    Allergies  No Known Allergies  History of Present Illness    Christy Heath is a 71 y.o. female who presents via audio/video conferencing for a telehealth visit today.  Pt was last seen in cardiology clinic on 08/29/21 by Dr. Kirke Corin.  At that time Kieanna Rollo was doing well.  The patient is now pending procedure as outlined above. Since her last visit, she  denies chest pain, lower extremity edema, fatigue, palpitations, melena, hematuria, hemoptysis, diaphoresis, weakness, presyncope, syncope, orthopnea, and PND. Reports that she has stable SOB but continues to walk on the treadmill 20-30 minutes several days per week without difficulty.    Home Medications    Prior to Admission medications   Medication Sig Start Date End Date Taking? Authorizing Provider  acetaminophen (TYLENOL) 325 MG tablet Take 650 mg by mouth every 6 (six) hours as needed for fever or moderate pain. Patient not taking: Reported on 08/29/2021    [provider]  Black Currant Seed Oil 500 MG CAPS Take 1,000 mg by mouth daily.    [provider]  Cholecalciferol (VITAMIN D3) 250 MCG (10000 UT) TABS Take 10,000 Units by mouth every Saturday.    [provider]  clopidogrel (PLAVIX) 75 MG tablet Take 1 tablet (75 mg total) by mouth daily. 08/29/21   Iran Ouch, MD  diclofenac Sodium (VOLTAREN) 1 % GEL Apply 4 g topically 4 (four) times daily. 04/06/21   Alwyn Ren, MD  HYDROcodone-acetaminophen (NORCO/VICODIN) 5-325 MG tablet Take 1-2 tablets by mouth every 4 (four) hours as needed. 09/10/21   Rolan Bucco, MD  loperamide (IMODIUM) 2 MG capsule Take 2 mg by mouth  as needed for diarrhea or loose stools. Patient not taking: Reported on 08/29/2021    [provider]  METHYLCELLULOSE OP Take 1,600 mg by mouth daily. Sea Moss dietary supplement/gel    [provider]  Multiple Vitamins-Minerals (EYE SUPPORT PO) Take 1 tablet by mouth daily.  Eyesight Max    [provider]  rosuvastatin (CRESTOR) 20 MG tablet Take 1 tablet (20 mg total) by mouth daily. 08/29/21   Iran Ouch, MD  tolterodine (DETROL LA) 2 MG 24 hr capsule Take 2 mg by mouth daily. Patient not taking: Reported on 08/29/2021 02/09/20   [provider]    Physical Exam    Vital Signs:  Tamekia Rotter does not have vital signs available for review today.  Given telephonic nature of communication, physical exam is limited. AAOx3. NAD. Normal affect.  Speech and respirations are unlabored.  Accessory Clinical Findings    None  Assessment & Plan    1.  Preoperative Cardiovascular Risk Assessment: Patient is doing well from a cardiac perspective and may proceed to surgery without further testing. She may hold Plavix for 5 days prior to procedure. According to the Revised Cardiac Risk Index (RCRI), her Perioperative Risk of Major Cardiac Event is (%): 6.6. Her Functional Capacity in METs is: 6.05 according to the Duke Activity Status Index (DASI).   A copy of this note will be routed to requesting surgeon.  Time:   Today, I have spent 10 minutes with the patient with telehealth technology discussing medical history, symptoms, and management plan.     Levi Aland, NP-C    11/13/2021, 2:02 PM [facility] [address], [address] Office [phone] Fax [phone]

## 2021-11-21 ENCOUNTER — Other Ambulatory Visit: Payer: Self-pay | Admitting: Family Medicine

## 2021-11-21 DIAGNOSIS — E2839 Other primary ovarian failure: Secondary | ICD-10-CM

## 2021-11-21 DIAGNOSIS — Z1231 Encounter for screening mammogram for malignant neoplasm of breast: Secondary | ICD-10-CM

## 2021-12-01 NOTE — Pre-Procedure Instructions (Addendum)
Surgical Instructions    Your procedure is scheduled on August, 3, 2023.  Report to [facility] Main Entrance "A" at 5:30 A.M., then check in with the Admitting office.  Call this number if you have problems the morning of surgery:  [phone]   If you have any questions prior to your surgery date call [phone]: Open Monday-Friday 8am-4pm    Remember:  Do not eat or drink after midnight the night before your surgery     Take these medicines the morning of surgery with A SIP OF WATER:  rosuvastatin (CRESTOR)  HYDROcodone-acetaminophen (NORCO/VICODIN) - may take as needed   Please follow your surgeons instructions on when to stop clopidogrel (PLAVIX). If you have not received instructions, please contact your surgeons office for further instructions.    As of today, STOP taking any Aspirin (unless otherwise instructed by your surgeon) Aleve, Naproxen, Ibuprofen, Motrin, Advil, Goody's, BC's, all herbal medications, fish oil, and all vitamins. This includes your medication: diclofenac Sodium (VOLTAREN) Gel                     Do NOT Smoke (Tobacco/Vaping) for 24 hours prior to your procedure.  If you use a CPAP at night, you may bring your mask/headgear for your overnight stay.   Contacts, glasses, piercing's, hearing aid's, dentures or partials may not be worn into surgery, please bring cases for these belongings.    For patients admitted to the hospital, discharge time will be determined by your treatment team.   Patients discharged the day of surgery will not be allowed to drive home, and someone needs to stay with them for 24 hours.  SURGICAL WAITING ROOM VISITATION Patients having surgery or a procedure may have no more than 2 support people in the waiting area - these visitors may rotate.   Children under the age of 13 must have an adult with them who is not the patient. If the patient needs to stay at the hospital during part of their recovery, the visitor guidelines  for inpatient rooms apply. Pre-op nurse will coordinate an appropriate time for 1 support person to accompany patient in pre-op.  This support person may not rotate.   Please refer to the [facility] website for the visitor guidelines for Inpatients (after your surgery is over and you are in a regular room).    Special instructions:   [place]- Preparing For Surgery  Before surgery, you can play an important role. Because skin is not sterile, your skin needs to be as free of germs as possible. You can reduce the number of germs on your skin by washing with CHG (chlorahexidine gluconate) Soap before surgery.  CHG is an antiseptic cleaner which kills germs and bonds with the skin to continue killing germs even after washing.    Oral Hygiene is also important to reduce your risk of infection.  Remember - BRUSH YOUR TEETH THE MORNING OF SURGERY WITH YOUR REGULAR TOOTHPASTE  Please do not use if you have an allergy to CHG or antibacterial soaps. If your skin becomes reddened/irritated stop using the CHG.  Do not shave (including legs and underarms) for at least 48 hours prior to first CHG shower. It is OK to shave your face.  Please follow these instructions carefully.   Shower the NIGHT BEFORE SURGERY and the MORNING OF SURGERY  If you chose to wash your hair, wash your hair first as usual with your normal shampoo.  After you shampoo, rinse your hair  and body thoroughly to remove the shampoo.  Use CHG Soap as you would any other liquid soap. You can apply CHG directly to the skin and wash gently with a scrungie or a clean washcloth.   Apply the CHG Soap to your body ONLY FROM THE NECK DOWN.  Do not use on open wounds or open sores. Avoid contact with your eyes, ears, mouth and genitals (private parts). Wash Face and genitals (private parts)  with your normal soap.   Wash thoroughly, paying special attention to the area where your surgery will be performed.  Thoroughly rinse your body with  warm water from the neck down.  DO NOT shower/wash with your normal soap after using and rinsing off the CHG Soap.  Pat yourself dry with a CLEAN TOWEL.  Wear CLEAN PAJAMAS to bed the night before surgery  Place CLEAN SHEETS on your bed the night before your surgery  DO NOT SLEEP WITH PETS.   Day of Surgery: Take a shower with CHG soap. Do not wear jewelry or makeup Do not wear lotions, powders, perfumes/colognes, or deodorant. Do not shave 48 hours prior to surgery.  Men may shave face and neck. Do not bring valuables to the hospital.  [facility] is not responsible for any belongings or valuables. Do not wear nail polish, gel polish, artificial nails, or any other type of covering on natural nails (fingers and toes) If you have artificial nails or gel coating that need to be removed by a nail salon, please have this removed prior to surgery. Artificial nails or gel coating may interfere with anesthesia's ability to adequately monitor your vital signs. Wear Clean/Comfortable clothing the morning of surgery Remember to brush your teeth WITH YOUR REGULAR TOOTHPASTE.   Please read over the following fact sheets that you were given.    If you received a COVID test during your pre-op visit  it is requested that you wear a mask when out in public, stay away from anyone that may not be feeling well and notify your surgeon if you develop symptoms. If you have been in contact with anyone that has tested positive in the last 10 days please notify you surgeon.

## 2021-12-04 ENCOUNTER — Encounter (HOSPITAL_COMMUNITY)
Admission: RE | Admit: 2021-12-04 | Discharge: 2021-12-04 | Disposition: A | Discharge status: Home / Self Care | Payer: Medicare Other | Source: Ambulatory Visit | Attending: Orthopedic Surgery | Admitting: Orthopedic Surgery

## 2021-12-04 ENCOUNTER — Other Ambulatory Visit: Payer: Self-pay

## 2021-12-04 ENCOUNTER — Encounter (HOSPITAL_COMMUNITY): Payer: Self-pay | Admitting: *Deleted

## 2021-12-04 VITALS — BP 160/68 | HR 67 | Temp 98.2°F | Resp 17 | Ht 61.0 in | Wt 161.9 lb

## 2021-12-04 DIAGNOSIS — M47812 Spondylosis without myelopathy or radiculopathy, cervical region: Secondary | ICD-10-CM | POA: Insufficient documentation

## 2021-12-04 DIAGNOSIS — Z01818 Encounter for other preprocedural examination: Secondary | ICD-10-CM | POA: Insufficient documentation

## 2021-12-04 DIAGNOSIS — I739 Peripheral vascular disease, unspecified: Secondary | ICD-10-CM | POA: Insufficient documentation

## 2021-12-04 DIAGNOSIS — I10 Essential (primary) hypertension: Secondary | ICD-10-CM | POA: Insufficient documentation

## 2021-12-04 DIAGNOSIS — R7303 Prediabetes: Secondary | ICD-10-CM | POA: Insufficient documentation

## 2021-12-04 DIAGNOSIS — E785 Hyperlipidemia, unspecified: Secondary | ICD-10-CM | POA: Insufficient documentation

## 2021-12-04 DIAGNOSIS — Z87891 Personal history of nicotine dependence: Secondary | ICD-10-CM | POA: Insufficient documentation

## 2021-12-04 HISTORY — DX: Prediabetes: R73.03

## 2021-12-04 HISTORY — DX: Peripheral vascular disease, unspecified: I73.9

## 2021-12-04 LAB — BASIC METABOLIC PANEL
Anion gap: 4 — ABNORMAL LOW (ref 5–15)
BUN: 11 mg/dL (ref 8–23)
CO2: 27 mmol/L (ref 22–32)
Calcium: 9 mg/dL (ref 8.9–10.3)
Chloride: 111 mmol/L (ref 98–111)
Creatinine, Ser: 0.74 mg/dL (ref 0.44–1.00)
GFR, Estimated: >60 mL/min (ref >60)
Glucose, Bld: 122 mg/dL — ABNORMAL HIGH (ref 70–99)
Potassium: 4.4 mmol/L (ref 3.5–5.1)
Sodium: 142 mmol/L (ref 135–145)

## 2021-12-04 LAB — TYPE AND SCREEN
ABO/RH(D): O POS
Antibody Screen: NEGATIVE

## 2021-12-04 LAB — SURGICAL PCR SCREEN
MRSA, PCR: NEGATIVE
Staphylococcus aureus: NEGATIVE

## 2021-12-04 LAB — CBC
HCT: 39.4 % (ref 36.0–46.0)
Hemoglobin: 12.9 g/dL (ref 12.0–15.0)
MCH: 31.2 pg (ref 26.0–34.0)
MCHC: 32.7 g/dL (ref 30.0–36.0)
MCV: 95.2 fL (ref 80.0–100.0)
Platelets: 265 10*3/uL (ref 150–400)
RBC: 4.14 MIL/uL (ref 3.87–5.11)
RDW: 14.7 % (ref 11.5–15.5)
WBC: 8.7 10*3/uL (ref 4.0–10.5)
nRBC: 0 % (ref 0.0–0.2)

## 2021-12-04 NOTE — Progress Notes (Addendum)
PCP - Nadyne Coombes, MD Cardiologist - Lorine Bears, MD clearance in Epic with Eligha Bridegroom, NP on 11/13/21  PPM/ICD - Denies-   Chest x-ray - 04/05/21 EKG - 04/05/21 Stress Test - Denies ECHO - Denies Cardiac Cath - Denies  Sleep Study - Denies  DM - Prediabetic  Blood Thinner Instructions: Plavix last dose 11/30/21  Anesthesia review: Yes cardiac history  Patient denies shortness of breath, fever, cough and chest pain at PAT appointment   All instructions explained to the patient, with a verbal understanding of the material. Patient agrees to go over the instructions while at home for a better understanding.  The opportunity to ask questions was provided.  Pharmacy updated med rec.  Patient can also take tylenol and lyrica if needed.  She can take her alendronate as well on day of surgery.

## 2021-12-05 ENCOUNTER — Encounter (HOSPITAL_COMMUNITY): Payer: Self-pay

## 2021-12-05 NOTE — Progress Notes (Addendum)
Anesthesia Chart Review:  Case: [id number] Date/Time: 12/07/21 0715   Procedure: ANTERIOR CERVICAL DECOMPRESSION/DISCECTOMY FUSION 3 LEVELS C4-7   Anesthesia type: General   Pre-op diagnosis: Cervical spondylotic myeloradiculopathy   [facility] / [facility]   Surgeons: Venita Lick, MD       DISCUSSION: Patient is a 71 year old female scheduled for the above procedure.   History includes former smoker (quit 05/07/20), HTN, HLD, pre-diabetes, PAD (left CIA angioplasty, orbital atherectomy and angioplasty left SFA & popliteal arteries 03/23/20).  PCP .Dois Davenport, MD medically cleared with recommendation for cardiac clearance as well.   Preoperative cardiology input outlined by Eligha Bridegroom, NP on 11/13/21, "Preoperative Cardiovascular Risk Assessment: Patient is doing well from a cardiac perspective and may proceed to surgery without further testing. She may hold Plavix for 5 days prior to procedure. According to the Revised Cardiac Risk Index (RCRI), her Perioperative Risk of Major Cardiac Event is (%): 6.6. Her Functional Capacity in METs is: 6.05 according to the Duke Activity Status Index (DASI)."  Reported last Plavix 11/30/21.   Anesthesia team to evaluate on the day of surgery.    VS: BP (!) 160/68   Pulse 67   Temp 36.8 C (Oral)   Resp 17   Ht 5\' 1"  (1.549 m)   Wt 73.4 kg   SpO2 100%   BMI 30.59 kg/m    PROVIDERS: , MD is PCP  Dois Davenport, MD is cardiologist   LABS: Labs reviewed: Acceptable for surgery. AST/ALT normal 04/04/21.  (all labs ordered are listed, but only abnormal results are displayed)  Labs Reviewed  BASIC METABOLIC PANEL - Abnormal; Notable for the following components:      Result Value   Glucose, Bld 122 (*)    Anion gap 4 (*)    All other components within normal limits  SURGICAL PCR SCREEN  CBC  TYPE AND SCREEN     IMAGES: 1V CXR 04/05/21: FINDINGS: The heart size and mediastinal contours are within  normal limits. Both lungs are clear. The visualized skeletal structures are unremarkable. IMPRESSION: No active disease.    EKG: 04/05/21: NSR   CV: Abdominal Aortogram with LE Runoff 03/23/20: 1.  Small abdominal aortic aneurysm above the iliac bifurcation 2.  Right lower extremity: Mild iliac disease, borderline significant common femoral artery disease, severe proximal SFA stenosis with a medium length occlusion in the distal SFA with reconstitution via collaterals in the proximal segment of the popliteal artery with three-vessel runoff below the knee. 3.  Left lower extremity: Severe stenosis in the common iliac artery, severe proximal SFA stenosis, severe calcified proximal popliteal artery stenosis and three-vessel runoff below the knee. 4.  Successful drug-coated balloon angioplasty to the left common iliac artery, orbital atherectomy and drug-coated balloon angioplasty to the left SFA and popliteal arteries.   Recommendations: Dual antiplatelet therapy for at least few months. Aggressive treatment of risk factors. Aortic aneurysm will need to be monitored and probably imaged with a different modality to better evaluate the size. Right lower extremity most likely will be managed medically.   Past Medical History:  Diagnosis Date   Hyperlipidemia    Hypertension    PAD (peripheral artery disease) (HCC)    Pre-diabetes     Past Surgical History:  Procedure Laterality Date   ABDOMINAL AORTOGRAM W/LOWER EXTREMITY N/A 03/23/2020   Procedure: ABDOMINAL AORTOGRAM W/LOWER EXTREMITY;  Surgeon: 03/25/2020, MD;  [facility];  Service: Cardiovascular;  Laterality: N/A;   EYE SURGERY Bilateral    cataract removal   PERIPHERAL VASCULAR ATHERECTOMY Left 03/23/2020   Procedure: PERIPHERAL VASCULAR ATHERECTOMY;  Surgeon: Iran Ouch, MD;  [facility];  Service: Cardiovascular;  Laterality: Left;   PERIPHERAL VASCULAR BALLOON ANGIOPLASTY  Left 03/23/2020   Procedure: PERIPHERAL VASCULAR BALLOON ANGIOPLASTY;  Surgeon: Iran Ouch, MD;  [facility];  Service: Cardiovascular;  Laterality: Left;    MEDICATIONS:  acetaminophen (TYLENOL) 500 MG tablet   alendronate (FOSAMAX) 70 MG tablet   Baclofen 5 MG TABS   Cholecalciferol (VITAMIN D3) 250 MCG (10000 UT) TABS   clopidogrel (PLAVIX) 75 MG tablet   diphenhydrAMINE (BENADRYL) 25 MG tablet   HYDROcodone-acetaminophen (NORCO/VICODIN) 5-325 MG tablet   Loperamide-Simethicone (IMODIUM MULTI-SYMPTOM RELIEF PO)   pregabalin (LYRICA) 100 MG capsule   rosuvastatin (CRESTOR) 20 MG tablet   No current facility-administered medications for this encounter.    Shonna Chock, PA-C Surgical Short Stay/Anesthesiology [facility] Phone [phone] [facility] Phone [phone] 12/05/2021 3:22 PM

## 2021-12-05 NOTE — Anesthesia Preprocedure Evaluation (Addendum)
Anesthesia Evaluation  Patient identified by MRN, date of birth, ID band Patient awake    Reviewed: Allergy & Precautions, NPO status , Patient's Chart, lab work & pertinent test results  Airway Mallampati: I       Dental no notable dental hx.    Pulmonary former smoker,    Pulmonary exam normal        Cardiovascular hypertension, Pt. on medications + Peripheral Vascular Disease  Normal cardiovascular exam     Neuro/Psych negative neurological ROS  negative psych ROS   GI/Hepatic negative GI ROS, Neg liver ROS,   Endo/Other  negative endocrine ROS  Renal/GU negative Renal ROSLab Results      Component                Value               Date                      CREATININE               0.74                12/04/2021                K                        4.4                 12/04/2021               negative genitourinary   Musculoskeletal   Abdominal (+) + obese (BMI 30.6),   Peds  Hematology negative hematology ROS (+) Lab Results      Component                Value               Date                            HGB                      12.9                12/04/2021                HCT                      39.4                12/04/2021               PLT                      265                 12/04/2021              Anesthesia Other Findings   Reproductive/Obstetrics                           Anesthesia Physical Anesthesia Plan  ASA: 2  Anesthesia Plan: General   Post-op Pain Management: Tylenol PO (pre-op)*   Induction: Intravenous  PONV Risk Score and Plan: Treatment may vary due to age or medical condition and Ondansetron  Airway Management Planned: Oral ETT  and Video Laryngoscope Planned  Additional Equipment: None  Intra-op Plan:   Post-operative Plan: Extubation in OR  Informed Consent: I have reviewed the patients History and Physical, chart, labs and discussed  the procedure including the risks, benefits and alternatives for the proposed anesthesia with the patient or authorized representative who has indicated his/her understanding and acceptance.     Dental advisory given  Plan Discussed with: CRNA  Anesthesia Plan Comments: (PAT note written 12/05/2021 by Shonna Chock, PA-C. )      Anesthesia Quick Evaluation

## 2021-12-07 ENCOUNTER — Inpatient Hospital Stay (HOSPITAL_COMMUNITY): Payer: Medicare Other | Admitting: Vascular Surgery

## 2021-12-07 ENCOUNTER — Inpatient Hospital Stay (HOSPITAL_COMMUNITY): Payer: Medicare Other | Admitting: Anesthesiology

## 2021-12-07 ENCOUNTER — Other Ambulatory Visit: Payer: Self-pay

## 2021-12-07 ENCOUNTER — Inpatient Hospital Stay (HOSPITAL_COMMUNITY): Payer: Medicare Other

## 2021-12-07 ENCOUNTER — Inpatient Hospital Stay (HOSPITAL_COMMUNITY)
Admission: RE | Admit: 2021-12-07 | Discharge: 2021-12-08 | DRG: 472 | Disposition: A | Discharge status: Home / Self Care | Payer: Medicare Other | Attending: Orthopedic Surgery | Admitting: Orthopedic Surgery

## 2021-12-07 ENCOUNTER — Encounter (HOSPITAL_COMMUNITY)
Admission: RE | Disposition: A | Discharge status: Home / Self Care | Payer: Self-pay | Source: Home / Self Care | Attending: Orthopedic Surgery

## 2021-12-07 ENCOUNTER — Encounter (HOSPITAL_COMMUNITY): Payer: Self-pay | Admitting: Orthopedic Surgery

## 2021-12-07 DIAGNOSIS — S0083XA Contusion of other part of head, initial encounter: Secondary | ICD-10-CM | POA: Diagnosis present

## 2021-12-07 DIAGNOSIS — Z7983 Long term (current) use of bisphosphonates: Secondary | ICD-10-CM

## 2021-12-07 DIAGNOSIS — I10 Essential (primary) hypertension: Secondary | ICD-10-CM | POA: Diagnosis present

## 2021-12-07 DIAGNOSIS — W19XXXA Unspecified fall, initial encounter: Secondary | ICD-10-CM | POA: Diagnosis present

## 2021-12-07 DIAGNOSIS — M5001 Cervical disc disorder with myelopathy,  high cervical region: Secondary | ICD-10-CM | POA: Diagnosis present

## 2021-12-07 DIAGNOSIS — Z79899 Other long term (current) drug therapy: Secondary | ICD-10-CM | POA: Diagnosis not present

## 2021-12-07 DIAGNOSIS — Z87891 Personal history of nicotine dependence: Secondary | ICD-10-CM

## 2021-12-07 DIAGNOSIS — M4802 Spinal stenosis, cervical region: Secondary | ICD-10-CM | POA: Diagnosis present

## 2021-12-07 DIAGNOSIS — G959 Disease of spinal cord, unspecified: Principal | ICD-10-CM | POA: Diagnosis present

## 2021-12-07 DIAGNOSIS — I739 Peripheral vascular disease, unspecified: Secondary | ICD-10-CM | POA: Diagnosis present

## 2021-12-07 DIAGNOSIS — M4712 Other spondylosis with myelopathy, cervical region: Secondary | ICD-10-CM

## 2021-12-07 DIAGNOSIS — M4722 Other spondylosis with radiculopathy, cervical region: Secondary | ICD-10-CM

## 2021-12-07 DIAGNOSIS — Z7902 Long term (current) use of antithrombotics/antiplatelets: Secondary | ICD-10-CM | POA: Diagnosis not present

## 2021-12-07 DIAGNOSIS — E785 Hyperlipidemia, unspecified: Secondary | ICD-10-CM | POA: Diagnosis present

## 2021-12-07 HISTORY — PX: ANTERIOR CERVICAL DECOMP/DISCECTOMY FUSION: SHX1161

## 2021-12-07 LAB — ABO/RH: ABO/RH(D): O POS

## 2021-12-07 SURGERY — ANTERIOR CERVICAL DECOMPRESSION/DISCECTOMY FUSION 3 LEVELS
Anesthesia: General | Site: Spine Cervical

## 2021-12-07 MED ORDER — METHOCARBAMOL 500 MG PO TABS: 500.0000 mg | ORAL_TABLET | Freq: Three times a day (TID) | ORAL | 0 refills | Status: AC | PRN

## 2021-12-07 MED ORDER — PHENOL 1.4 % MT LIQD
1.0000 | OROMUCOSAL | Status: DC | PRN
  Filled 2021-12-07: qty 177

## 2021-12-07 MED ORDER — ROCURONIUM BROMIDE 10 MG/ML (PF) SYRINGE
PREFILLED_SYRINGE | INTRAVENOUS | Status: DC | PRN
  Administered 2021-12-07: 20 mg via INTRAVENOUS
  Administered 2021-12-07: 10 mg via INTRAVENOUS
  Administered 2021-12-07: 50 mg via INTRAVENOUS
  Administered 2021-12-07: 20 mg via INTRAVENOUS

## 2021-12-07 MED ORDER — HYDROMORPHONE HCL 1 MG/ML IJ SOLN
INTRAMUSCULAR | Status: AC
  Filled 2021-12-07: qty 1

## 2021-12-07 MED ORDER — LACTATED RINGERS IV SOLN: INTRAVENOUS | Status: DC

## 2021-12-07 MED ORDER — AMISULPRIDE (ANTIEMETIC) 5 MG/2ML IV SOLN: 10.0000 mg | Freq: Once | INTRAVENOUS | Status: DC | PRN

## 2021-12-07 MED ORDER — 0.9 % SODIUM CHLORIDE (POUR BTL) OPTIME
TOPICAL | Status: DC | PRN
  Administered 2021-12-07 (×3): 1000 mL

## 2021-12-07 MED ORDER — SUGAMMADEX SODIUM 200 MG/2ML IV SOLN
INTRAVENOUS | Status: DC | PRN
  Administered 2021-12-07: 70 mg via INTRAVENOUS

## 2021-12-07 MED ORDER — ACETAMINOPHEN 10 MG/ML IV SOLN
1000.0000 mg | Freq: Once | INTRAVENOUS | Status: DC | PRN
Start: 2021-12-07 — End: 2021-12-07

## 2021-12-07 MED ORDER — PROPOFOL 10 MG/ML IV BOLUS
INTRAVENOUS | Status: AC
  Filled 2021-12-07: qty 20

## 2021-12-07 MED ORDER — KETOROLAC TROMETHAMINE 0.5 % OP SOLN
OPHTHALMIC | Status: AC
  Filled 2021-12-07: qty 5

## 2021-12-07 MED ORDER — BSS IO SOLN
15.0000 mL | Freq: Once | INTRAOCULAR | Status: AC
  Administered 2021-12-07: 15 mL

## 2021-12-07 MED ORDER — BUPIVACAINE-EPINEPHRINE (PF) 0.25% -1:200000 IJ SOLN
INTRAMUSCULAR | Status: AC
  Filled 2021-12-07: qty 30

## 2021-12-07 MED ORDER — POLYETHYLENE GLYCOL 3350 17 G PO PACK: 17.0000 g | PACK | Freq: Every day | ORAL | Status: DC | PRN

## 2021-12-07 MED ORDER — ORAL CARE MOUTH RINSE: 15.0000 mL | Freq: Once | OROMUCOSAL | Status: AC

## 2021-12-07 MED ORDER — ACETAMINOPHEN 325 MG PO TABS
650.0000 mg | ORAL_TABLET | ORAL | Status: DC | PRN
  Administered 2021-12-07 – 2021-12-08 (×3): 650 mg via ORAL
  Filled 2021-12-07 (×3): qty 2

## 2021-12-07 MED ORDER — ONDANSETRON HCL 4 MG/2ML IJ SOLN
INTRAMUSCULAR | Status: DC | PRN
  Administered 2021-12-07: 4 mg via INTRAVENOUS

## 2021-12-07 MED ORDER — CEFAZOLIN SODIUM-DEXTROSE 2-4 GM/100ML-% IV SOLN
2.0000 g | INTRAVENOUS | Status: AC
  Administered 2021-12-07 (×2): 2 g via INTRAVENOUS
  Filled 2021-12-07: qty 100

## 2021-12-07 MED ORDER — LACTATED RINGERS IV SOLN
INTRAVENOUS | Status: DC
Start: 2021-12-07 — End: 2021-12-08

## 2021-12-07 MED ORDER — HYDROMORPHONE HCL 1 MG/ML IJ SOLN
0.2500 mg | INTRAMUSCULAR | Status: DC | PRN
  Administered 2021-12-07: 0.5 mg via INTRAVENOUS

## 2021-12-07 MED ORDER — CEFAZOLIN SODIUM-DEXTROSE 1-4 GM/50ML-% IV SOLN
1.0000 g | Freq: Three times a day (TID) | INTRAVENOUS | Status: AC
  Administered 2021-12-07 – 2021-12-08 (×2): 1 g via INTRAVENOUS
  Filled 2021-12-07 (×2): qty 50

## 2021-12-07 MED ORDER — SODIUM CHLORIDE 0.9% FLUSH: 3.0000 mL | INTRAVENOUS | Status: DC | PRN

## 2021-12-07 MED ORDER — ONDANSETRON HCL 4 MG/2ML IJ SOLN: 4.0000 mg | Freq: Four times a day (QID) | INTRAMUSCULAR | Status: DC | PRN

## 2021-12-07 MED ORDER — METHOCARBAMOL 500 MG PO TABS
500.0000 mg | ORAL_TABLET | Freq: Four times a day (QID) | ORAL | Status: DC | PRN
  Administered 2021-12-07 – 2021-12-08 (×2): 500 mg via ORAL
  Filled 2021-12-07 (×2): qty 1

## 2021-12-07 MED ORDER — FENTANYL CITRATE (PF) 250 MCG/5ML IJ SOLN
INTRAMUSCULAR | Status: DC | PRN
Start: 2021-12-07 — End: 2021-12-07
  Administered 2021-12-07: 100 ug via INTRAVENOUS
  Administered 2021-12-07 (×3): 50 ug via INTRAVENOUS

## 2021-12-07 MED ORDER — PREGABALIN 100 MG PO CAPS: 100.0000 mg | ORAL_CAPSULE | Freq: Four times a day (QID) | ORAL | Status: DC | PRN

## 2021-12-07 MED ORDER — SODIUM CHLORIDE 0.9% FLUSH
3.0000 mL | Freq: Two times a day (BID) | INTRAVENOUS | Status: DC
  Administered 2021-12-07: 3 mL via INTRAVENOUS

## 2021-12-07 MED ORDER — DEXAMETHASONE SODIUM PHOSPHATE 10 MG/ML IJ SOLN
INTRAMUSCULAR | Status: DC | PRN
  Administered 2021-12-07: 10 mg via INTRAVENOUS

## 2021-12-07 MED ORDER — OXYCODONE HCL 5 MG/5ML PO SOLN: 5.0000 mg | Freq: Once | ORAL | Status: DC | PRN

## 2021-12-07 MED ORDER — ACETAMINOPHEN 650 MG RE SUPP: 650.0000 mg | RECTAL | Status: DC | PRN

## 2021-12-07 MED ORDER — PROPOFOL 10 MG/ML IV BOLUS
INTRAVENOUS | Status: DC | PRN
  Administered 2021-12-07: 150 mg via INTRAVENOUS

## 2021-12-07 MED ORDER — PHENYLEPHRINE 80 MCG/ML (10ML) SYRINGE FOR IV PUSH (FOR BLOOD PRESSURE SUPPORT)
PREFILLED_SYRINGE | INTRAVENOUS | Status: DC | PRN
  Administered 2021-12-07: 160 ug via INTRAVENOUS
  Administered 2021-12-07: 80 ug via INTRAVENOUS
  Administered 2021-12-07 (×2): 160 ug via INTRAVENOUS

## 2021-12-07 MED ORDER — SODIUM CHLORIDE 0.9 % IV SOLN: INTRAVENOUS | Status: DC

## 2021-12-07 MED ORDER — ONDANSETRON HCL 4 MG PO TABS: 4.0000 mg | ORAL_TABLET | Freq: Four times a day (QID) | ORAL | Status: DC | PRN

## 2021-12-07 MED ORDER — OXYCODONE HCL 5 MG PO TABS
10.0000 mg | ORAL_TABLET | ORAL | Status: DC | PRN
  Administered 2021-12-08 (×2): 10 mg via ORAL
  Filled 2021-12-07: qty 2

## 2021-12-07 MED ORDER — PHENYLEPHRINE HCL-NACL 20-0.9 MG/250ML-% IV SOLN
INTRAVENOUS | Status: DC | PRN
  Administered 2021-12-07: 30 ug/min via INTRAVENOUS

## 2021-12-07 MED ORDER — TRANEXAMIC ACID 1000 MG/10ML IV SOLN
2000.0000 mg | Freq: Once | INTRAVENOUS | Status: AC
  Administered 2021-12-07: 2000 mg via TOPICAL
  Filled 2021-12-07: qty 20

## 2021-12-07 MED ORDER — SURGIFLO WITH THROMBIN (HEMOSTATIC MATRIX KIT) OPTIME
TOPICAL | Status: DC | PRN
  Administered 2021-12-07 (×2): 1 via TOPICAL

## 2021-12-07 MED ORDER — CHLORHEXIDINE GLUCONATE 0.12 % MT SOLN
15.0000 mL | Freq: Once | OROMUCOSAL | Status: AC
  Administered 2021-12-07: 15 mL via OROMUCOSAL
  Filled 2021-12-07: qty 15

## 2021-12-07 MED ORDER — KETOROLAC TROMETHAMINE 0.5 % OP SOLN
1.0000 [drp] | Freq: Four times a day (QID) | OPHTHALMIC | Status: DC
  Administered 2021-12-07: 1 [drp] via OPHTHALMIC

## 2021-12-07 MED ORDER — MENTHOL 3 MG MT LOZG: 1.0000 | LOZENGE | OROMUCOSAL | Status: DC | PRN

## 2021-12-07 MED ORDER — FLEET ENEMA 7-19 GM/118ML RE ENEM: 1.0000 | ENEMA | Freq: Once | RECTAL | Status: DC | PRN

## 2021-12-07 MED ORDER — ONDANSETRON HCL 4 MG/2ML IJ SOLN: 4.0000 mg | Freq: Once | INTRAMUSCULAR | Status: DC | PRN

## 2021-12-07 MED ORDER — HYDROMORPHONE HCL 1 MG/ML IJ SOLN: 0.5000 mg | INTRAMUSCULAR | Status: AC | PRN

## 2021-12-07 MED ORDER — LIDOCAINE 2% (20 MG/ML) 5 ML SYRINGE
INTRAMUSCULAR | Status: DC | PRN
  Administered 2021-12-07: 80 mg via INTRAVENOUS

## 2021-12-07 MED ORDER — CEFAZOLIN SODIUM-DEXTROSE 2-4 GM/100ML-% IV SOLN
INTRAVENOUS | Status: AC
  Filled 2021-12-07: qty 100

## 2021-12-07 MED ORDER — BSS IO SOLN
INTRAOCULAR | Status: AC
  Filled 2021-12-07: qty 15

## 2021-12-07 MED ORDER — BUPIVACAINE-EPINEPHRINE 0.25% -1:200000 IJ SOLN
INTRAMUSCULAR | Status: DC | PRN
  Administered 2021-12-07: 10 mL

## 2021-12-07 MED ORDER — LACTATED RINGERS IV SOLN: INTRAVENOUS | Status: DC | PRN

## 2021-12-07 MED ORDER — THROMBIN 20000 UNITS EX SOLR
CUTANEOUS | Status: AC
  Filled 2021-12-07: qty 20000

## 2021-12-07 MED ORDER — OXYCODONE-ACETAMINOPHEN 10-325 MG PO TABS: 1.0000 | ORAL_TABLET | Freq: Four times a day (QID) | ORAL | 0 refills | Status: AC | PRN

## 2021-12-07 MED ORDER — THROMBIN 20000 UNITS EX SOLR: CUTANEOUS | Status: DC | PRN

## 2021-12-07 MED ORDER — ROSUVASTATIN CALCIUM 20 MG PO TABS
20.0000 mg | ORAL_TABLET | Freq: Every day | ORAL | Status: DC
Start: 2021-12-07 — End: 2021-12-08
  Administered 2021-12-07: 20 mg via ORAL
  Filled 2021-12-07: qty 1

## 2021-12-07 MED ORDER — METHOCARBAMOL 1000 MG/10ML IJ SOLN: 500.0000 mg | Freq: Four times a day (QID) | INTRAVENOUS | Status: DC | PRN

## 2021-12-07 MED ORDER — ONDANSETRON HCL 4 MG PO TABS: 4.0000 mg | ORAL_TABLET | Freq: Three times a day (TID) | ORAL | 0 refills | Status: DC | PRN

## 2021-12-07 MED ORDER — FENTANYL CITRATE (PF) 250 MCG/5ML IJ SOLN
INTRAMUSCULAR | Status: AC
  Filled 2021-12-07: qty 5

## 2021-12-07 MED ORDER — OXYCODONE HCL 5 MG PO TABS: 5.0000 mg | ORAL_TABLET | Freq: Once | ORAL | Status: DC | PRN

## 2021-12-07 MED ORDER — OXYCODONE HCL 5 MG PO TABS
5.0000 mg | ORAL_TABLET | ORAL | Status: DC | PRN
  Administered 2021-12-07 – 2021-12-08 (×2): 5 mg via ORAL
  Filled 2021-12-07 (×4): qty 1

## 2021-12-07 SURGICAL SUPPLY — 74 items
BAG COUNTER SPONGE SURGICOUNT (BAG) IMPLANT
BAG DECANTER FOR FLEXI CONT (MISCELLANEOUS) ×1 IMPLANT
BLADE CLIPPER SURG (BLADE) IMPLANT
BUR EGG ELITE 4.0 (BURR) IMPLANT
BUR MATCHSTICK NEURO 3.0 LAGG (BURR) IMPLANT
CABLE BIPOLOR RESECTION CORD (MISCELLANEOUS) ×3 IMPLANT
CAGE ENDOSKELETON TC 14X12X5 (Cage) ×1 IMPLANT
CAGE LORDOTIC 6 SM (Cage) ×2 IMPLANT
CANISTER SUCT 3000ML PPV (MISCELLANEOUS) ×2 IMPLANT
CLSR STERI-STRIP ANTIMIC 1/2X4 (GAUZE/BANDAGES/DRESSINGS) ×2 IMPLANT
CNTNR URN SCR LID CUP LEK RST (MISCELLANEOUS) IMPLANT
CONT SPEC 4OZ STRL OR WHT (MISCELLANEOUS) ×2
COVER MAYO STAND STRL (DRAPES) ×6 IMPLANT
COVER SURGICAL LIGHT HANDLE (MISCELLANEOUS) ×4 IMPLANT
DRAIN CHANNEL 15F RND FF W/TCR (WOUND CARE) IMPLANT
DRAPE C-ARM 42X72 X-RAY (DRAPES) ×2 IMPLANT
DRAPE POUCH INSTRU U-SHP 10X18 (DRAPES) ×2 IMPLANT
DRAPE SURG 17X23 STRL (DRAPES) ×2 IMPLANT
DRAPE U-SHAPE 47X51 STRL (DRAPES) ×2 IMPLANT
DRSG OPSITE POSTOP 3X4 (GAUZE/BANDAGES/DRESSINGS) ×1 IMPLANT
DRSG OPSITE POSTOP 4X6 (GAUZE/BANDAGES/DRESSINGS) ×2 IMPLANT
DURAPREP 26ML APPLICATOR (WOUND CARE) ×2 IMPLANT
ELECT COATED BLADE 2.86 ST (ELECTRODE) ×2 IMPLANT
ELECT PENCIL ROCKER SW 15FT (MISCELLANEOUS) ×2 IMPLANT
ELECT REM PT RETURN 9FT ADLT (ELECTROSURGICAL) ×2
ELECTRODE REM PT RTRN 9FT ADLT (ELECTROSURGICAL) ×1 IMPLANT
GLOVE BIO SURGEON STRL SZ 6.5 (GLOVE) ×2 IMPLANT
GLOVE BIOGEL PI IND STRL 6.5 (GLOVE) ×1 IMPLANT
GLOVE BIOGEL PI IND STRL 8.5 (GLOVE) ×1 IMPLANT
GLOVE BIOGEL PI INDICATOR 6.5 (GLOVE) ×1
GLOVE BIOGEL PI INDICATOR 8.5 (GLOVE) ×1
GLOVE SS BIOGEL STRL SZ 8.5 (GLOVE) ×1 IMPLANT
GLOVE SUPERSENSE BIOGEL SZ 8.5 (GLOVE) ×1
GOWN STRL REUS W/ TWL LRG LVL3 (GOWN DISPOSABLE) ×2 IMPLANT
GOWN STRL REUS W/TWL 2XL LVL3 (GOWN DISPOSABLE) ×3 IMPLANT
GOWN STRL REUS W/TWL LRG LVL3 (GOWN DISPOSABLE) ×1
KIT BASIN OR (CUSTOM PROCEDURE TRAY) ×2 IMPLANT
KIT TURNOVER KIT B (KITS) ×2 IMPLANT
NDL SPNL 18GX3.5 QUINCKE PK (NEEDLE) ×1 IMPLANT
NEEDLE HYPO 22GX1.5 SAFETY (NEEDLE) ×2 IMPLANT
NEEDLE SPNL 18GX3.5 QUINCKE PK (NEEDLE) ×2 IMPLANT
NS IRRIG 1000ML POUR BTL (IV SOLUTION) ×4 IMPLANT
PACK ORTHO CERVICAL (CUSTOM PROCEDURE TRAY) ×2 IMPLANT
PACK UNIVERSAL I (CUSTOM PROCEDURE TRAY) ×2 IMPLANT
PAD ARMBOARD 7.5X6 YLW CONV (MISCELLANEOUS) ×6 IMPLANT
PATTIES SURGICAL .25X.25 (GAUZE/BANDAGES/DRESSINGS) ×1 IMPLANT
PATTIES SURGICAL .5 X.5 (GAUZE/BANDAGES/DRESSINGS) ×1 IMPLANT
PIN ACP TEMP FIXATION (EXFIX) ×1 IMPLANT
PIN DISTRACTION MAXCESS-C 14 (PIN) ×2 IMPLANT
PLATE ACP 1.9X52 3LVL (Plate) ×1 IMPLANT
POSITIONER HEAD DONUT 9IN (MISCELLANEOUS) ×2 IMPLANT
PUTTY BONE DBX 5CC MIX (Putty) ×1 IMPLANT
RESTRAINT LIMB HOLDER UNIV (RESTRAINTS) ×2 IMPLANT
SCREW ACP 3.5 X 13 S/D VARIA (Screw) ×12 IMPLANT
SCREW ACP 3.5X13 S/D VAR ANGLE (Screw) IMPLANT
SCREW ACP VA ST 4X13 (Screw) ×2 IMPLANT
SPONGE INTESTINAL PEANUT (DISPOSABLE) ×4 IMPLANT
SPONGE SURGIFOAM ABS GEL 100 (HEMOSTASIS) ×2 IMPLANT
SPONGE T-LAP 4X18 ~~LOC~~+RFID (SPONGE) ×4 IMPLANT
SURGIFLO W/THROMBIN 8M KIT (HEMOSTASIS) ×2 IMPLANT
SUT BONE WAX W31G (SUTURE) ×2 IMPLANT
SUT MNCRL AB 3-0 PS2 27 (SUTURE) ×2 IMPLANT
SUT SILK 2 0 (SUTURE) ×1
SUT SILK 2-0 18XBRD TIE 12 (SUTURE) ×1 IMPLANT
SUT VIC AB 2-0 CT1 18 (SUTURE) ×3 IMPLANT
SYR BULB IRRIG 60ML STRL (SYRINGE) ×2 IMPLANT
SYR CONTROL 10ML LL (SYRINGE) ×2 IMPLANT
TAPE CLOTH 4X10 WHT NS (GAUZE/BANDAGES/DRESSINGS) ×2 IMPLANT
TAPE UMBILICAL 1/8X30 (MISCELLANEOUS) ×4 IMPLANT
TAPE UMBILICAL COTTON 1/8X30 (MISCELLANEOUS) ×2 IMPLANT
TOWEL GREEN STERILE (TOWEL DISPOSABLE) ×2 IMPLANT
TOWEL GREEN STERILE FF (TOWEL DISPOSABLE) ×2 IMPLANT
TRAY FOLEY MTR SLVR 16FR STAT (SET/KITS/TRAYS/PACK) ×2 IMPLANT
WATER STERILE IRR 1000ML POUR (IV SOLUTION) ×2 IMPLANT

## 2021-12-07 NOTE — Anesthesia Procedure Notes (Signed)
Procedure Name: Intubation Date/Time: 12/07/2021 7:52 AM  Performed by: Alease Medina, CRNAPre-anesthesia Checklist: Patient identified, Emergency Drugs available, Suction available and Patient being monitored Patient Re-evaluated:Patient Re-evaluated prior to induction Oxygen Delivery Method: Circle system utilized Preoxygenation: Pre-oxygenation with 100% oxygen Induction Type: IV induction Ventilation: Mask ventilation without difficulty Laryngoscope Size: Glidescope and 3 Grade View: Grade I Tube type: Oral Tube size: 7.0 mm Number of attempts: 1 Airway Equipment and Method: Video-laryngoscopy and Rigid stylet Placement Confirmation: ETT inserted through vocal cords under direct vision, positive ETCO2 and breath sounds checked- equal and bilateral Secured at: 21 cm Tube secured with: Tape Dental Injury: Teeth and Oropharynx as per pre-operative assessment  Comments: Head and neck maintained in neutral spine position for intubation.

## 2021-12-07 NOTE — Transfer of Care (Signed)
Immediate Anesthesia Transfer of Care Note  Patient: Christy Heath  Procedure(s) Performed: ANTERIOR CERVICAL DISCECTOMY AND FUSION CERVICAL FOUR TO SEVEN (Spine Cervical)  Patient Location: PACU  Anesthesia Type:General  Level of Consciousness: drowsy and patient cooperative  Airway & Oxygen Therapy: Patient Spontanous Breathing and Patient connected to nasal cannula oxygen  Post-op Assessment: Report given to RN, Post -op Vital signs reviewed and stable and Patient moving all extremities X 4  Post vital signs: Reviewed and stable  Last Vitals:  Vitals Value Taken Time  BP 139/80   Temp    Pulse 82   Resp 14   SpO2 92%     Last Pain:  Vitals:   12/07/21 0606  TempSrc:   PainSc: 0-No pain      Patients Stated Pain Goal: 0 (12/07/21 0606)  Complications: No notable events documented.

## 2021-12-07 NOTE — Brief Op Note (Signed)
12/07/2021  12:23 PM  PATIENT:  Christy Heath  71 y.o. female  PRE-OPERATIVE DIAGNOSIS:  Cervical spondylotic myeloradiculopathy  POST-OPERATIVE DIAGNOSIS:  Cervical spondylotic myeloradiculopathy  PROCEDURE:  Procedure(s): ANTERIOR CERVICAL DISCECTOMY AND FUSION CERVICAL FOUR TO SEVEN (N/A)  SURGEON:  Surgeon(s) and Role:    Venita Lick, MD - Primary  PHYSICIAN ASSISTANT:   ASSISTANTS: none   ANESTHESIA:   general  EBL:  100 mL   BLOOD ADMINISTERED:none  DRAINS: none   LOCAL MEDICATIONS USED:  MARCAINE     SPECIMEN:  No Specimen  DISPOSITION OF SPECIMEN:  N/A  COUNTS:  YES  TOURNIQUET:  * No tourniquets in log *  DICTATION: .Dragon Dictation  PLAN OF CARE: Admit for overnight observation  PATIENT DISPOSITION:  PACU - hemodynamically stable.

## 2021-12-07 NOTE — H&P (Signed)
History:  Christy Heath continues to have severe neck and left radicular arm pain. Her clinical exam demonstrates no signs of myelopathy other than the positive Hoffman test, but the MRI still continues to show cord signal changes suggestive of myelomalacia. Patient has moderate to severe foraminal stenosis on the left side C3-T1. Clinically however he has left neuropathic arm pain in the C6 and C7 dermatome. Based on her clinical exam the foraminal stenosis affecting the C6 and C7 nerve correlates with the C5-6 and C6-7 disc space levels. The area of maximum cord signal change is at C4-5. As a result we will move forward with the ACDF C4-7   Past Medical History:  Diagnosis Date   Hyperlipidemia    Hypertension    PAD (peripheral artery disease) (HCC)    Pre-diabetes     No Known Allergies  No current facility-administered medications on file prior to encounter.   Current Outpatient Medications on File Prior to Encounter  Medication Sig Dispense Refill   acetaminophen (TYLENOL) 500 MG tablet Take 500 mg by mouth daily as needed for headache.     alendronate (FOSAMAX) 70 MG tablet Take 70 mg by mouth every Friday. Take with a full glass of water on an empty stomach.     Baclofen 5 MG TABS Take 5 mg by mouth 3 (three) times daily as needed (muscle spasms).     Cholecalciferol (VITAMIN D3) 250 MCG (10000 UT) TABS Take 10,000 Units by mouth every Saturday.     clopidogrel (PLAVIX) 75 MG tablet Take 1 tablet (75 mg total) by mouth daily. 30 tablet 5   diphenhydrAMINE (BENADRYL) 25 MG tablet Take 25 mg by mouth daily as needed for itching or allergies.     HYDROcodone-acetaminophen (NORCO/VICODIN) 5-325 MG tablet Take 1-2 tablets by mouth every 4 (four) hours as needed. 12 tablet 0   Loperamide-Simethicone (IMODIUM MULTI-SYMPTOM RELIEF PO) Take 1 tablet by mouth daily as needed (upset stomach).     pregabalin (LYRICA) 100 MG capsule Take 100 mg by mouth 4 (four) times daily as needed (pain).      rosuvastatin (CRESTOR) 20 MG tablet Take 1 tablet (20 mg total) by mouth daily. 90 tablet 3    Physical Exam: Vitals:   12/07/21 0548  BP: 134/64  Pulse: 79  Resp: 18  Temp: 97.9 F (36.6 C)  SpO2: 98%   Body mass index is 30.23 kg/m.  Clinical exam: [name] is a pleasant individual, who appears younger than their stated age.  She is alert and orientated 3.  No shortness of breath, chest pain.  Abdomen is soft and non-tender, negative loss of bowel and bladder control, no rebound tenderness.  Negative: skin lesions abrasions contusions  Peripheral pulses: 2+ dorsalis pedis/posterior tibialis /radial artery pulses bilaterally. Compartment soft and nontender.  Gait pattern: Normal  Assistive devices: None  Neuro: 5/5 motor strength in the upper extremity bilaterally. positive Hoffman test, no clonus, negative Spurling sign. Intermittent dysesthesias and neuropathic pain into the left greater than right upper extremity.  Musculoskeletal: Moderate to severe neck pain with palpation and range of motion. No occipital headaches.  Cervical x-rays taken today in the office (AP/lateral) were reviewed: Demonstrate degenerative disc disease C4-7. Normal sagittal alignment. No fractures seen  Cervical MRI: completed on 10/21/2021: Unchanged moderate right and moderate to severe left foraminal stenosis at C3-4 with central disc protrusion. Moderate to severe biforaminal stenosis at C4-5 with cord flattening and unchanged signal abnormality when compared to prior 05/21/2021  MRI. Moderate to severe biforaminal stenosis at C5-6 and C6-7. Moderate to severe left foraminal narrowing at C7-T1. No acute fracture is seen.    A/P: Summary: Murel continues to have severe neck and left radicular arm pain. Her clinical exam demonstrates no signs of myelopathy other than the positive Hoffman test, but the MRI still continues to show cord signal changes suggestive of myelomalacia. Patient has  moderate to severe foraminal stenosis on the left side C3-T1. Clinically however he has left neuropathic arm pain in the C6 and C7 dermatome. Based on her clinical exam the foraminal stenosis affecting the C6 and C7 nerve correlates with the C5-6 and C6-7 disc space levels. The area of maximum cord signal change is at C4-5. At this point time the patient has expressed a desire to move forward with surgery. Her quality of life is continued to deteriorate. We did discuss injection therapy and physical therapy but she states she would like to move forward with surgery. At this point because of the cord signal changes and the potential for progression of myelopathy I think it is reasonable to move forward with surgery. To address the myelopathy I recommend an ACDF at C4-5. To address the primary clinical areas of radiculopathy I would extend his ACDF and include the C5/6 and C6/7 levels. We discussed how she has other areas of degeneration that could result in increasing symptoms and the need for future further surgery. I have reviewed the surgery itself and all of her questions were addressed. She is expressed an understanding of the risks and benefits as well as risks surgery Risks and benefits of surgery were discussed with the patient. These include: Infection, bleeding, death, stroke, paralysis, ongoing or worse pain, need for additional surgery, nonunion, leak of spinal fluid, adjacent segment degeneration requiring additional fusion surgery. Pseudoarthrosis (nonunion)requiring supplemental posterior fixation. Throat pain, swallowing difficulties, hoarseness or change in voice.  Addendum: Patient fell yesterday at the grocery store.  She struck her face left wrist and right chest.  On exam she has some tenderness at the level of the thumb are present.  X-rays of the forearm and the demonstrate any fracture or dislocation.  Chest x-ray: No active disease.  No effusion or pneumothorax..She had some facial  bruising.  Patient denied loss of consciousness.  After discussing issues with anesthesia we elected to move forward with surgery.

## 2021-12-07 NOTE — Discharge Instructions (Addendum)
Today you will be discharged from the hospital.  The purpose of the following handout is to help guide you over the next 2 weeks.  First and foremost, be sure you have a follow up appointment with Dr. Shon Baton 2 weeks from the time of your surgery to have your sutures removed.  Please call [facility] [phone] to schedule or confirm this appointment.      Brace You do not have to wear the collar while lying in bed or sitting in a high-backed chair, eating, sleeping or showering.  Other than these instances, you must wear the brace.  You may NOT wear the collar while driving a vehicle (see driving restrictions below).  It is advisable that you wear the collar in public places or while traveling in a car as a passenger.  Dr. Shon Baton will discuss further use of the collar at your 2 week postop visit.  Wound Care You may SHOWER 5 days from the date of surgery.  Shower directly over the steri-strips.  DO NOT scrub or submerge (bath tub, swimming pool, hot tub, etc.) the area.  Pat to dry following your shower.  There is no need for additional dressings other than the steri-strips.  Allow the steri-strips to fall off on their own.  Once the strips have fallen off, you may leave the area undressed.  DO NOT apply lotion/cream/ointment to the area.  The wound must remain dry at all times other than while showering.  Dr. Shon Baton or his staff will remove your stiches at your first postop visit and give you additional instructions regarding wound care at that time.   Activity NO DRIVING FOR 2 WEEKS.  No lifting over 5 pounds (approximately a gallon of milk).  No bending, stooping, squatting or twisting.  No overhead activities.  We encourage you to walk (short distances and often throughout the day) as you can tolerate.  A good rule of thumb is to get up and move once or twice every hour.  You may go up and down stairs carefully.  As you continue to recover, Dr. Shon Baton will address and adjust  restrictions to your activities until no further restrictions are needed.  However, until your first postop visit, when Dr. Shon Baton can assess your recovery, you are to follow these instructions.  At the end of this document is a tentative outline of activities for up to 1 year.     RESTART PLAVIX ON SUNDAY 12/10/21 CALL IF YOU NOTICE SWELLING, BLEEDING OR DIFFICULTY BREATHING  Medication You will be discharged from the hospital with medication for pain, spasm, nausea and constipation.  You will be given enough medication to last until your first postop visit in 2 weeks.  Medications WILL NOT BE REFILLED EARLY; therefore, you are to take the medications only as directed.  If you have been given multiple prescriptions, please leave them with your pharmacy.  They can keep them on file for when you need them.  Medications that are lost or stolen WILL NOT be replaced.  We will address the need for continuing certain medications on an individual basis during your postop visit.  We ask that you avoid over the counter anti-inflammatory medications (Advil, Aleve, Motrin) for 3 months.    What you can expect following neck surgery... It is not uncommon to experience a sore throat or difficulty swallowing following neck surgery.  Cold liquids and soft foods are helpful in soothing this discomfort.  There is no specific diet  that you are to follow after surgery, however, there are a few things you should keep in mind to avoid unneeded discomfort.  Take small bites and eat slowly.  Chew your food thoroughly before swallowing.   It is not uncommon to experience incisional soreness or pain in the back of the neck, shoulders or between the shoulder blades.  These symptoms will slowly begin to resolve as you continue to recover, however, they can last for a few weeks.    It is not uncommon to experience INTERMITTENT arm pain following surgery.  This pain can mimic the arm pain you had prior to surgery.  As long as the  pain resolves on its own and is not constant, there is no need to become alarmed.   When To Call If you experience fever >101F, loss of bowel or bladder control, painful swelling in the lower extremities, constant (unresolving) arm pain.  If you experience any of these symptoms, please call [facility] [phone].  What's Next As mentioned earlier, you will follow up with Dr. Shon Baton in 2 weeks.  At that time, we will likely remove your stitches and discuss additional aspects of your recovery.    ACTIVITY GUIDELINES ANTERIOR CERVICAL DISECTOMY AND FUSION  Activity Discharge 2 weeks 6 weeks 3 months 6 months 1 year  Shower 5 days        Submerge the wound  no no yes     Walking outdoors yes       Lifting 5 lbs yes       Climbing stairs yes       Cooking yes       Car rides (less than 30 minutes) yes       Car rides (greater than 30 minutes) no varies yes     Air travel no varies yes     Short outings [employer], visits, etc...) yes       School no no yes     Driving a car no no varies yes    Light upper extremity exercises no no varies yes    Stationary bike no no yes     Swimming (no diving) no no no varies yes   Vacuuming, laundry, mopping no no no varies yes   Biking outdoors no no no no varies yes  Light jogging no no no varies yes   Low impact aerobics no no no varies yes   Non-contact sports (tennis, golf) no no no varies yes   Hunting (no tree climbing) no no no varies yes   Dancing (non-gymnastics) no no no varies yes   Down-hill skiing (experienced skier) no no no no yes   Down-hill skiing (novice) no no no no yes   Cross-country skiing no no no no yes   Horseback riding (noncompetitive)  no no no no yes   Horseback riding (competitive) no no no no varies yes  Gardening/landscaping no no no varies yes   House repairs no no no varies varies yes  Lifting up to 50 lbs no no no no varies yes

## 2021-12-07 NOTE — Op Note (Signed)
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  C4-5: 5 mm small lordotic cage.  C5-7: 6 mm small lordotic cage.  NuVasive anterior ACP plate with 13 mm locking screws  Graft: DBX mix  BRIEF HISTORY: Christy Heath is a 71 y.o. female who presented to my office with complaints of severe neck and radicular arm pain.  Patient had MRI findings consistent with myelomalacia and foraminal stenosis with nerve root compression.  As result of the failure to improve and progressive deterioration in quality of life we elected to move forward with surgery.  All appropriate risks benefits and alternatives were discussed with the patient and consent was obtained.  PROCEDURE DETAILS: Patient was brought into the operating room and was properly positioned on the operating room table.  After induction with general anesthesia the patient was endotracheally intubated.  A timeout was taken to confirm all important data: including patient, procedure, and the level. Teds, SCD's were applied.   A Foley was placed by the nurse and the anterior cervical spine was prepped and draped in a standard fashion.  Using fluoroscopy identified the midportion of the C5 vertebral body and marked out my incision.  I infiltrated this incision site with quarter percent Marcaine with epinephrine and made a transverse incision.  Sharp dissection was carried out down to and through the platysma.  This was a standard Smith-Robinson approach to the cervical spine.  I continued to sharply dissect through the deep cervical fascia  until identified and isolated the omohyoid muscle.  The omohyoid muscle was sacrificed for improved visualization.  Then dissected through the prevertebral fascia until I was able to visualize the anterior longitudinal ligament.  Hand-held retractors were placed to retract the esophagus and trachea to the right and then I used Kitner dissectors to complete my exposure from the superior aspect of C4 to the inferior aspect of C7.  A needle was placed into the C4-5 disc space level and an x-ray was used to confirm that I was at the appropriate level.  I then marked the disc space with Bovie and then began mobilizing the longus coli muscles.  Using bipolar cautery I mobilized the longus coli muscle from the superior aspect of C4 to the inferior aspect of C7 bilaterally.  The anterior osteophytes at the level of the disc spaces were then removed with Leksell rongeur.  As part retracting blades were placed and the endotracheal cuff was deflated and expanded the retractor to expose the C4-5 disc space.  Once the retractor was expanded the endotracheal cuff was reinflated.  Annulotomy was performed with a 15 blade scalpel and I remove the bulk of the disc material with pituitary rongeurs.  The overhanging osteophyte was removed with a 2 mm Kerrison rongeur and then I used curettes to remove the remainder of the cartilaginous endplate and continue to work posteriorly until I could see the posterior aspect of the vertebral body.  A 1 mm Kerrison rongeur was then used to remove the posterior annulus.  I trimmed down the posterior osteophytes and then began dissecting with my nerve hook through the posterior longitudinal ligament until I  could create a plane between the PLL and the underlying thecal sac.  I then used my 1 mm Kerrison rongeur to resect the posterior longitudinal ligament.  I then undercut the uncovertebral joints bilaterally.  Using fluoroscopy I confirmed that I could easily pass my nerve hook underneath the  vertebral body of C4-C5 and under the uncovertebral joints bilaterally.  Once I confirmed an adequate decompression I irrigated joint with normal saline.  I rasped the endplates and then used the trialing devices to determine the best implant.  The 5 mm small lordotic cage was packed with allograft and then malleted into position.  I then repositioned the Caspar retractor blades to expose the C6-7 disc space.  An annulotomy was performed and using the same technique I used at C4-5 I performed a complete discectomy at this level.  I again took down the posterior annulus and posterior longitudinal ligament to adequately decompress the thecal sac.  This allowed me to decompress the tubal joints and further decompress the nerve roots bilaterally.  Wound was irrigated and I rasped the endplates and placed the 6 mm small lordotic cage into this level.  The cage was packed with DBX mix prior to insertion.  At C5-6 I performed an ACDF using the same technique that continues to be at the 2 levels I again took down the posterior longitudinal ligament and make sure that hemostasis prior to implantation cages.  I had adequately decompressed the uncovertebral joints and the thecal sac.  The endplates were rasped and a 6 mm small lordotic cage was placed.  The distraction pins were all removed and the wound was irrigated with normal saline.  All bony edges that were bleeding were sealed with bone wax.  An anterior cervical plate was then contoured and secured with a temporary set.  13 mm locking screws were then placed and secured into the bodies to C4, 5, 6, and 7.  Screws had excellent purchase.  The locking device was engaged to prevent backout of the screws per manufacture standards.  At this point the wound was copiously irrigated and there was no bleeding noted.  Irrigation was coming out clear.  The trach and esophagus were then returned to midline and the platysma was closed with interrupted 2-0 Vicryl suture.  Skin  was closed with 3-0 Monocryl.  Steri-Strips and a dry dressing were applied and the patient was ultimately extubated and transferred the PACU without incident at the end of the case all needle punch counts were correct.  There were no adverse intraoperative events.  Venita Lick, MD 12/07/2021 12:15 PM

## 2021-12-07 NOTE — Anesthesia Postprocedure Evaluation (Signed)
Anesthesia Post Note  Patient: Christy Heath  Procedure(s) Performed: ANTERIOR CERVICAL DISCECTOMY AND FUSION CERVICAL FOUR TO SEVEN (Spine Cervical)     Patient location during evaluation: PACU Anesthesia Type: General Level of consciousness: awake Pain management: pain level controlled Vital Signs Assessment: post-procedure vital signs reviewed and stable Respiratory status: spontaneous breathing Cardiovascular status: stable Postop Assessment: no apparent nausea or vomiting Anesthetic complications: yes (presumptive R corneal abrasion)   No notable events documented.  Last Vitals:  Vitals:   12/07/21 1514 12/07/21 1928  BP: (!) 188/91 (!) 152/71  Pulse: 88 93  Resp: 18 18  Temp: 36.9 C 37.6 C  SpO2: 99% 92%    Last Pain:  Vitals:   12/07/21 2103  TempSrc:   PainSc: 3                  John F Scharlene Corn

## 2021-12-08 NOTE — Progress Notes (Signed)
Patient alert and oriented, mae's well, voiding adequate amount of urine, swallowing without difficulty, no c/o pain at time of discharge. Patient discharged home with family. Script and discharged instructions given to patient. Patient and family stated understanding of instructions given. Patient has an appointment with Dr. Shon Baton in 2 weeks. Patient waiting for her daughter for ride home

## 2021-12-08 NOTE — Evaluation (Signed)
Occupational Therapy Evaluation Patient Details Name: Christy Heath MRN: [id number] DOB: Jun 23, 1950 Today's Date: 12/08/2021   History of Present Illness Pt is a 71 y.o female s/p ACDF C4-7 on 12/07/2021. PMH significant for HLD, HTN, PAD, and pre-diabetes.   Clinical Impression   PTA, pt was mod I for ADL and assisted daughter with some IADLs in their home. Curently, pt performing brace application with min A for achievement of correct placement, LB ADL with min guard A, and functional mobility with min guard A. Pt educated and demonstrating compensatory techniques for LB dressing, brace application, oral care, shower transfers, toileting, and bed mobility within spinal precautions. All questions answered, all education provided. Recommend discharge home with no OT follow up. OT to sign off. Re-consult if change in status.    Recommendations for follow up therapy are one component of a multi-disciplinary discharge planning process, led by the attending physician.  Recommendations may be updated based on patient status, additional functional criteria and insurance authorization.   Follow Up Recommendations  No OT follow up    Assistance Recommended at Discharge Intermittent Supervision/Assistance  Patient can return home with the following A little help with walking and/or transfers;A little help with bathing/dressing/bathroom;Assistance with cooking/housework;Assist for transportation;Help with stairs or ramp for entrance    Functional Status Assessment     Equipment Recommendations  BSC/3in1    Recommendations for Other Services PT consult     Precautions / Restrictions Precautions Precautions: Back Precaution Booklet Issued: Yes (comment) Precaution Comments: Handout provided Required Braces or Orthoses: Cervical Brace Cervical Brace: Hard collar Restrictions Weight Bearing Restrictions: No      Mobility Bed Mobility Overal bed mobility: Needs Assistance Bed Mobility:  Rolling, Sidelying to Sit, Sit to Sidelying Rolling: Supervision Sidelying to sit: Supervision     Sit to sidelying: Supervision General bed mobility comments: Supervision afterinitial education for safety    Transfers Overall transfer level: Needs assistance Equipment used: Rolling walker (2 wheels), None Transfers: Sit to/from Stand Sit to Stand: Min guard           General transfer comment: Performing functional transfers with min guard A and increased time.      Balance Overall balance assessment: Needs assistance Sitting-balance support: No upper extremity supported, Feet supported Sitting balance-Leahy Scale: Good Sitting balance - Comments: Performing figure 4 position for LB dressing sitting EOB   Standing balance support: No upper extremity supported, During functional activity Standing balance-Leahy Scale: Good Standing balance comment: Performing simulated tub/shower transfer with min guad A over ~24 in obstacle                           ADL either performed or assessed with clinical judgement   ADL Overall ADL's : Needs assistance/impaired Eating/Feeding: Set up;Sitting   Grooming: Min guard;Standing   Upper Body Bathing: Set up;Sitting   Lower Body Bathing: Min guard;Sit to/from stand   Upper Body Dressing : Set up;Sitting   Lower Body Dressing: Min guard;Sit to/from stand Lower Body Dressing Details (indicate cue type and reason): Educated and demonstrating use of figure 4 position Toilet Transfer: Min guard;Comfort height toilet;Ambulation Toilet Transfer Details (indicate cue type and reason): simulated in room with min guard A     Tub/ Shower Transfer: Min guard;Tub transfer;BSC/3in1 Tub/Shower Transfer Details (indicate cue type and reason): Performing with min guard A during session Functional mobility during ADLs: Min guard       Vision   Vision Assessment?: No  apparent visual deficits     Perception     Praxis       Pertinent Vitals/Pain Pain Assessment Pain Assessment: Faces Faces Pain Scale: Hurts little more Pain Location: operative site Pain Descriptors / Indicators: Discomfort, Operative site guarding Pain Intervention(s): Limited activity within patient's tolerance     Hand Dominance     Extremity/Trunk Assessment Upper Extremity Assessment Upper Extremity Assessment: Generalized weakness;LUE deficits/detail LUE Deficits / Details: Bruising due to fall prior to surgery. full ROM. decreased strength. Using functionally throughout session.   Lower Extremity Assessment Lower Extremity Assessment: Defer to PT evaluation   Cervical / Trunk Assessment Cervical / Trunk Assessment: Neck Surgery   Communication Communication Communication: No difficulties   Cognition Arousal/Alertness: Awake/alert Behavior During Therapy: WFL for tasks assessed/performed Overall Cognitive Status: Within Functional Limits for tasks assessed                                 General Comments: Pt reporting her memory is not great, but recalling all precautions at end of session, with initial education provided at beginning of session.     General Comments  VSS    Exercises     Shoulder Instructions      Home Living Family/patient expects to be discharged to:: Private residence Living Arrangements: Spouse/significant other;Children Available Help at Discharge: Family;Available PRN/intermittently Type of Home: House Home Access: Level entry (Level entry at garage where pt usually enters home)     Home Layout: Two level Alternate Level Stairs-Number of Steps: 12-13 Alternate Level Stairs-Rails: Right Bathroom Shower/Tub: Chief Strategy Officer: Standard     Home Equipment: Cane - single point          Prior Functioning/Environment Prior Level of Function : Independent/Modified Independent             Mobility Comments: Pt reporting occasional use of cane ADLs  Comments: Pt reporting independent with ADL, assists her daughter with cooking at home, not driving        OT Problem List: Decreased strength;Decreased activity tolerance;Decreased knowledge of precautions;Pain      OT Treatment/Interventions:      OT Goals(Current goals can be found in the care plan section) Acute Rehab OT Goals Patient Stated Goal: Go home OT Goal Formulation: With patient  OT Frequency:      Co-evaluation              AM-PAC OT "6 Clicks" Daily Activity     Outcome Measure Help from another person eating meals?: A Little Help from another person taking care of personal grooming?: A Little Help from another person toileting, which includes using toliet, bedpan, or urinal?: A Little Help from another person bathing (including washing, rinsing, drying)?: A Little Help from another person to put on and taking off regular upper body clothing?: A Little Help from another person to put on and taking off regular lower body clothing?: A Little 6 Click Score: 18   End of Session Equipment Utilized During Treatment: Gait belt Nurse Communication: Mobility status  Activity Tolerance: Patient tolerated treatment well Patient left: in bed;with call bell/phone within reach  OT Visit Diagnosis: Unsteadiness on feet (R26.81);Muscle weakness (generalized) (M62.81);Pain Pain - part of body:  (operative site)                Time: 3559-7416 OT Time Calculation (min): 24 min Charges:  OT General Charges $OT Visit: 1  Visit OT Evaluation $OT Eval Low Complexity: 1 Low OT Treatments $Self Care/Home Management : 8-22 mins  Ladene Artist, OTR/L [facility] Acute Rehabilitation Office: [phone]   Drue Novel 12/08/2021, 10:34 AM

## 2021-12-08 NOTE — Evaluation (Signed)
Physical Therapy Evaluation Patient Details Name: Christy Heath MRN: [id number] DOB: [birth year] Today's Date: 12/08/2021  History of Present Illness  Pt is a 71 y.o female s/p C4-7 ACDF on 12/07/2021. PMH significant for HTN, PAD, and pre-diabetes.  Clinical Impression  Pt admitted with above diagnosis. At the time of PT eval, pt was able to demonstrate transfers and ambulation with gross min guard assist to supervision for safety and RW for support. Pt was educated on precautions, brace application/wearing schedule, appropriate activity progression, and car transfer. Pt currently with functional limitations due to the deficits listed below (see PT Problem List). Pt will benefit from skilled PT to increase their independence and safety with mobility to allow discharge to the venue listed below.         Recommendations for follow up therapy are one component of a multi-disciplinary discharge planning process, led by the attending physician.  Recommendations may be updated based on patient status, additional functional criteria and insurance authorization.  Follow Up Recommendations No PT follow up      Assistance Recommended at Discharge PRN  Patient can return home with the following  A little help with walking and/or transfers;A little help with bathing/dressing/bathroom;Assistance with cooking/housework;Assist for transportation;Help with stairs or ramp for entrance    Equipment Recommendations Rolling walker (2 wheels);BSC/3in1  Recommendations for Other Services       Functional Status Assessment Patient has had a recent decline in their functional status and demonstrates the ability to make significant improvements in function in a reasonable and predictable amount of time.     Precautions / Restrictions Precautions Precautions: Back Precaution Booklet Issued: Yes (comment) Precaution Comments: Reviewed handout and pt was cued for precautions during functional mobility. Required  Braces or Orthoses: Cervical Brace Cervical Brace: Hard collar Restrictions Weight Bearing Restrictions: No      Mobility  Bed Mobility Overal bed mobility: Needs Assistance Bed Mobility: Rolling, Sidelying to Sit, Sit to Sidelying Rolling: Supervision Sidelying to sit: Supervision     Sit to sidelying: Supervision General bed mobility comments: HOB elevated. Pt was able to transition to/from EOB without assistance. Good log roll technique.    Transfers Overall transfer level: Needs assistance Equipment used: Rolling walker (2 wheels), None Transfers: Sit to/from Stand Sit to Stand: Min guard           General transfer comment: Light guard for power up to full stand. Increased time but overall steady    Ambulation/Gait Ambulation/Gait assistance: Min guard Gait Distance (Feet): 500 Feet Assistive device: Rolling walker (2 wheels) Gait Pattern/deviations: Step-through pattern, Decreased stride length, Trunk flexed Gait velocity: Decreased Gait velocity interpretation: 1.31 - 2.62 ft/sec, indicative of limited community ambulator   General Gait Details: Slow but generally steady with RW for support. No overt LOB noted. Pt motivated for distance.  Stairs Stairs: Yes Stairs assistance: Min guard Stair Management: One rail Right, Step to pattern, Forwards Number of Stairs: 10 General stair comments: VC's for sequencing and general safety. No assist required however hands on guarding provided for safety.  Wheelchair Mobility    Modified Rankin (Stroke Patients Only)       Balance Overall balance assessment: Needs assistance Sitting-balance support: No upper extremity supported, Feet supported Sitting balance-Leahy Scale: Fair     Standing balance support: No upper extremity supported, During functional activity, Reliant on assistive device for balance Standing balance-Leahy Scale: Poor  Pertinent Vitals/Pain Pain  Assessment Pain Assessment: Faces Faces Pain Scale: Hurts little more Pain Location: operative site Pain Descriptors / Indicators: Discomfort, Operative site guarding Pain Intervention(s): Limited activity within patient's tolerance, Monitored during session, Repositioned    Home Living Family/patient expects to be discharged to:: Private residence Living Arrangements: Spouse/significant other;Children Available Help at Discharge: Family;Available PRN/intermittently Type of Home: House Home Access: Level entry (Level entry at garage where pt usually enters home)     Alternate Level Stairs-Number of Steps: 12-13 Home Layout: Two level Home Equipment: Cane - single point      Prior Function Prior Level of Function : Independent/Modified Independent             Mobility Comments: Pt reporting occasional use of cane ADLs Comments: Pt reporting independent with ADL, assists her daughter with cooking at home, not driving     Hand Dominance        Extremity/Trunk Assessment   Upper Extremity Assessment Upper Extremity Assessment: Defer to OT evaluation LUE Deficits / Details: Bruising due to fall prior to surgery. full ROM. decreased strength. Using functionally throughout session.    Lower Extremity Assessment Lower Extremity Assessment: Generalized weakness (Mild; consistent with pre-op diagnosis)    Cervical / Trunk Assessment Cervical / Trunk Assessment: Neck Surgery  Communication   Communication: No difficulties  Cognition Arousal/Alertness: Awake/alert Behavior During Therapy: WFL for tasks assessed/performed Overall Cognitive Status: Within Functional Limits for tasks assessed                                          General Comments General comments (skin integrity, edema, etc.): VSS    Exercises     Assessment/Plan    PT Assessment Patient needs continued PT services  PT Problem List Decreased strength;Decreased activity  tolerance;Decreased balance;Decreased mobility;Decreased knowledge of use of DME;Decreased safety awareness;Decreased knowledge of precautions;Pain       PT Treatment Interventions DME instruction;Stair training;Gait training;Functional mobility training;Therapeutic activities;Therapeutic exercise;Patient/family education;Balance training    PT Goals (Current goals can be found in the Care Plan section)  Acute Rehab PT Goals Patient Stated Goal: Home today PT Goal Formulation: With patient Time For Goal Achievement: 12/15/21 Potential to Achieve Goals: Good    Frequency Min 5X/week     Co-evaluation               AM-PAC PT "6 Clicks" Mobility  Outcome Measure Help needed turning from your back to your side while in a flat bed without using bedrails?: A Little Help needed moving from lying on your back to sitting on the side of a flat bed without using bedrails?: A Little Help needed moving to and from a bed to a chair (including a wheelchair)?: A Little Help needed standing up from a chair using your arms (e.g., wheelchair or bedside chair)?: A Little Help needed to walk in hospital room?: A Little Help needed climbing 3-5 steps with a railing? : A Little 6 Click Score: 18    End of Session Equipment Utilized During Treatment: Gait belt Activity Tolerance: Patient tolerated treatment well Patient left: in bed;with call bell/phone within reach Nurse Communication: Mobility status PT Visit Diagnosis: Unsteadiness on feet (R26.81);Pain Pain - part of body:  (neck)    Time: 8676-7209 PT Time Calculation (min) (ACUTE ONLY): 19 min   Charges:   PT Evaluation $PT Eval Low Complexity: 1 Low PT  Treatments $Gait Training: 8-22 mins        Conni Slipper, PT, DPT Acute Rehabilitation Services Secure Chat Preferred Office: [phone]   Marylynn Pearson 12/08/2021, 12:53 PM

## 2021-12-08 NOTE — Progress Notes (Signed)
    Subjective: Procedure(s) (LRB): ANTERIOR CERVICAL DISCECTOMY AND FUSION CERVICAL FOUR TO SEVEN (N/A) 1 Day Post-Op  Patient reports pain as 2 on 0-10 scale.  Reports decreased arm pain She reports incisional neck pain   Positive void Negative bowel movement Positive flatus Negative chest pain or shortness of breath  Objective: Vital signs in last 24 hours: Temp:  [97.3 F (36.3 C)-99.6 F (37.6 C)] 98.7 F (37.1 C) (08/04 0424) Pulse Rate:  [73-93] 73 (08/04 0424) Resp:  [16-18] 18 (08/04 0424) BP: (129-188)/(69-91) 155/69 (08/04 0424) SpO2:  [91 %-99 %] 97 % (08/04 0424)  Intake/Output from previous day: 08/03 0701 - 08/04 0700 In: 1880 [P.O.:480; I.V.:1200; IV Piggyback:200] Out: 645 [Urine:545; Blood:100]  Labs: No results for input(s): "WBC", "RBC", "HCT", "PLT" in the last 72 hours. No results for input(s): "NA", "K", "CL", "CO2", "BUN", "CREATININE", "GLUCOSE", "CALCIUM" in the last 72 hours. No results for input(s): "LABPT", "INR" in the last 72 hours.  Physical Exam: Neurologically intact ABD soft Intact pulses distally Incision: dressing C/D/I Compartment soft Body mass index is 30.23 kg/m.  Assessment/Plan: Patient stable  xrays N/A Mobilization with physical therapy Encourage incentive spirometry Continue care  Patient is doing well.  She states she felt a little dizzy when she returned to her room from walking.  Vital signs are stable.  I have encouraged oral fluids to prevent dehydration.  She will work with physical therapy.  She is remained stable and has no further incidences then she can be discharged later today otherwise she will be discharged in the morning.  Venita Lick, MD [facility] [phone]

## 2021-12-11 ENCOUNTER — Encounter (HOSPITAL_COMMUNITY): Payer: Self-pay | Admitting: Orthopedic Surgery

## 2021-12-18 NOTE — Discharge Summary (Signed)
Patient ID: Christy Heath MRN: [id number] DOB/AGE: Nov 22, 1950 71 y.o.  Admit date: 12/07/2021 Discharge date: 12/18/2021  Admission Diagnoses:  Principal Problem:   Cervical myelopathy [facility])   Discharge Diagnoses:  Principal Problem:   Cervical myelopathy (HCC)  status post Procedure(s): ANTERIOR CERVICAL DISCECTOMY AND FUSION CERVICAL FOUR TO SEVEN  Past Medical History:  Diagnosis Date   Hyperlipidemia    Hypertension    PAD (peripheral artery disease) (HCC)    Pre-diabetes     Surgeries: Procedure(s): ANTERIOR CERVICAL DISCECTOMY AND FUSION CERVICAL FOUR TO SEVEN on 12/07/2021   Consultants:   Discharged Condition: Improved  Hospital Course: Nicola Heinemann is an 71 y.o. female who was admitted 12/07/2021 for operative treatment of Cervical myelopathy (HCC). Patient failed conservative treatments (please see the history and physical for the specifics) and had severe unremitting pain that affects sleep, daily activities and work/hobbies. After pre-op clearance, the patient was taken to the operating room on 12/07/2021 and underwent  Procedure(s): ANTERIOR CERVICAL DISCECTOMY AND FUSION CERVICAL FOUR TO SEVEN.    Patient was given perioperative antibiotics:  Anti-infectives (From admission, onward)    Start     Dose/Rate Route Frequency Ordered Stop   12/07/21 2000  ceFAZolin (ANCEF) IVPB 1 g/50 mL premix        1 g 100 mL/hr over 30 Minutes Intravenous Every 8 hours 12/07/21 1440 12/08/21 0453   12/07/21 0549  ceFAZolin (ANCEF) IVPB 2g/100 mL premix        2 g 200 mL/hr over 30 Minutes Intravenous 30 min pre-op 12/07/21 0549 12/07/21 1204        Patient was given sequential compression devices and early ambulation to prevent DVT.   Patient benefited maximally from hospital stay and there were no complications. At the time of discharge, the patient was urinating/moving their bowels without difficulty, tolerating a regular diet, pain is controlled with oral pain  medications and they have been cleared by PT/OT.   Recent vital signs: No data found.   Recent laboratory studies: No results for input(s): "WBC", "HGB", "HCT", "PLT", "NA", "K", "CL", "CO2", "BUN", "CREATININE", "GLUCOSE", "INR", "CALCIUM" in the last 72 hours.  Invalid input(s): "PT", "2"   Discharge Medications:   Allergies as of 12/08/2021   No Known Allergies      Medication List     STOP taking these medications    acetaminophen 500 MG tablet Commonly known as: TYLENOL   alendronate 70 MG tablet Commonly known as: FOSAMAX   Baclofen 5 MG Tabs   clopidogrel 75 MG tablet Commonly known as: PLAVIX   HYDROcodone-acetaminophen 5-325 MG tablet Commonly known as: NORCO/VICODIN   IMODIUM MULTI-SYMPTOM RELIEF PO   Vitamin D3 250 MCG (10000 UT) Tabs       TAKE these medications    diphenhydrAMINE 25 MG tablet Commonly known as: BENADRYL Take 25 mg by mouth daily as needed for itching or allergies.   ondansetron 4 MG tablet Commonly known as: Zofran Take 1 tablet (4 mg total) by mouth every 8 (eight) hours as needed for nausea or vomiting.   pregabalin 100 MG capsule Commonly known as: LYRICA Take 100 mg by mouth 4 (four) times daily as needed (pain).   rosuvastatin 20 MG tablet Commonly known as: CRESTOR Take 1 tablet (20 mg total) by mouth daily.       ASK your doctor about these medications    methocarbamol 500 MG tablet Commonly known as: ROBAXIN Take 1 tablet (500 mg total) by mouth every 8 (  eight) hours as needed for up to 5 days for muscle spasms. Ask about: Should I take this medication?   oxyCODONE-acetaminophen 10-325 MG tablet Commonly known as: Percocet Take 1 tablet by mouth every 6 (six) hours as needed for up to 5 days for pain. Ask about: Should I take this medication?        Diagnostic Studies: DG Cervical Spine 2 or 3 views  Result Date: 12/07/2021 CLINICAL DATA:  ACDF C4 through C7 EXAM: CERVICAL SPINE - 2-3 VIEW COMPARISON:   None Available. FINDINGS: Eight C-arm images of the cervical spine were obtained during surgery. Image number 1 demonstrates needle localization C5-6 disc space. Subsequent images reveal ACDF C4-5, C5-6, C6-7. Anterior plate and screws in good position. Interbody spacers in good position. IMPRESSION: ACDF C4 through C7. Electronically Signed   By: Marlan Palau M.D.   On: 12/07/2021 12:50   DG C-Arm 1-60 Min-No Report  Result Date: 12/07/2021 Fluoroscopy was utilized by the requesting physician.  No radiographic interpretation.   DG C-Arm 1-60 Min-No Report  Result Date: 12/07/2021 Fluoroscopy was utilized by the requesting physician.  No radiographic interpretation.   DG C-Arm 1-60 Min-No Report  Result Date: 12/07/2021 Fluoroscopy was utilized by the requesting physician.  No radiographic interpretation.   DG C-Arm 1-60 Min-No Report  Result Date: 12/07/2021 Fluoroscopy was utilized by the requesting physician.  No radiographic interpretation.   DG Wrist Complete Left  Result Date: 12/07/2021 CLINICAL DATA:  Preop for spine surgery.  Fall with left wrist pain. EXAM: LEFT WRIST - COMPLETE 3+ VIEW COMPARISON:  None Available. FINDINGS: There is no evidence of fracture or dislocation. Generalized osteopenia. IMPRESSION: Negative. Electronically Signed   By: Tiburcio Pea M.D.   On: 12/07/2021 07:26   DG CHEST PORT 1 VIEW  Result Date: 12/07/2021 CLINICAL DATA:  Preop for spine surgery. EXAM: PORTABLE CHEST 1 VIEW COMPARISON:  04/05/2021 FINDINGS: Normal heart size and mediastinal contours. No acute infiltrate or edema. No effusion or pneumothorax. No acute osseous findings. IMPRESSION: No active disease. Electronically Signed   By: Tiburcio Pea M.D.   On: 12/07/2021 07:26    Discharge Instructions     Incentive spirometry RT   Complete by: As directed         Follow-up Information     Venita Lick, MD Follow up in 2 week(s).   Specialty: Orthopedic Surgery Why: If symptoms  worsen, For wound re-check, For suture removal Contact information: [address] [place] [address] [phone]                 Discharge Plan:  discharge to home  Disposition: Patient was doing quite well postoperatively.  Pain was controlled with oral medications and she was tolerating a regular diet.  She had no shortness of breath or swelling at the incision site.  Her neurological exam remained intact with improved balance with ambulation.  Patient was discharged to home with appropriate instructions and medications.  She will follow-up with me in 2 weeks for reevaluation.    Signed: Alvy Beal for Dr. Venita Lick [facility] [phone] 12/18/2021, 7:20 AM

## 2022-01-03 ENCOUNTER — Other Ambulatory Visit (HOSPITAL_COMMUNITY): Payer: Self-pay | Admitting: Cardiovascular Disease

## 2022-01-03 ENCOUNTER — Ambulatory Visit (HOSPITAL_COMMUNITY)
Admission: RE | Admit: 2022-01-03 | Discharge: 2022-01-03 | Disposition: A | Discharge status: Home / Self Care | Payer: Medicare Other | Source: Ambulatory Visit | Attending: Internal Medicine | Admitting: Internal Medicine

## 2022-01-03 DIAGNOSIS — I714 Abdominal aortic aneurysm, without rupture, unspecified: Secondary | ICD-10-CM

## 2022-01-03 DIAGNOSIS — I7143 Infrarenal abdominal aortic aneurysm, without rupture: Secondary | ICD-10-CM | POA: Diagnosis not present

## 2022-01-03 DIAGNOSIS — I708 Atherosclerosis of other arteries: Secondary | ICD-10-CM

## 2022-01-11 ENCOUNTER — Other Ambulatory Visit: Payer: Self-pay | Admitting: *Deleted

## 2022-01-11 DIAGNOSIS — I739 Peripheral vascular disease, unspecified: Secondary | ICD-10-CM

## 2022-01-30 ENCOUNTER — Ambulatory Visit: Payer: Medicare Other | Attending: Cardiovascular Disease | Admitting: Cardiovascular Disease

## 2022-01-30 ENCOUNTER — Encounter: Payer: Self-pay | Admitting: Cardiovascular Disease

## 2022-01-30 VITALS — BP 130/72 | HR 68 | Ht 61.5 in | Wt 158.4 lb

## 2022-01-30 DIAGNOSIS — R072 Precordial pain: Secondary | ICD-10-CM | POA: Insufficient documentation

## 2022-01-30 DIAGNOSIS — R079 Chest pain, unspecified: Secondary | ICD-10-CM | POA: Diagnosis not present

## 2022-01-30 DIAGNOSIS — E785 Hyperlipidemia, unspecified: Secondary | ICD-10-CM | POA: Insufficient documentation

## 2022-01-30 DIAGNOSIS — R0602 Shortness of breath: Secondary | ICD-10-CM | POA: Insufficient documentation

## 2022-01-30 MED ORDER — METOPROLOL TARTRATE 100 MG PO TABS: ORAL_TABLET | ORAL | 0 refills | Status: AC

## 2022-01-30 NOTE — Patient Instructions (Addendum)
Medication Instructions:  Your physician recommends that you continue on your current medications as directed. Please refer to the Current Medication list given to you today.  *If you need a refill on your cardiac medications before your next appointment, please call your pharmacy*   Lab Work: TODAY: CMP, Lipids If you have labs (blood work) drawn today and your tests are completely normal, you will receive your results only by: [place] (if you have MyChart) OR A paper copy in the mail If you have any lab test that is abnormal or we need to change your treatment, we will call you to review the results.   Testing/Procedures: Your physician has requested that you have an echocardiogram. Echocardiography is a painless test that uses sound waves to create images of your heart. It provides your doctor with information about the size and shape of your heart and how well your heart's chambers and valves are working. This procedure takes approximately one hour. There are no restrictions for this procedure.    Your cardiac CT will be scheduled at one of the below locations:   [facility] [address] [place], [place] [address] [phone]   If scheduled at [facility], please arrive at the [facility] and Children's Entrance (Entrance C2) of [facility] 30 minutes prior to test start time. You can use the FREE valet parking offered at entrance C (encouraged to control the heart rate for the test)  Proceed to the [facility] Radiology Department (first floor) to check-in and test prep.  All radiology patients and guests should use entrance C2 at [facility], accessed from [facility], even though the hospital's physical address listed is [address].      Please follow these instructions carefully (unless otherwise directed):   On the Night Before the Test: Be sure to Drink plenty of water. Do not consume any  caffeinated/decaffeinated beverages or chocolate 12 hours prior to your test. Do not take any antihistamines 12 hours prior to your test.  On the Day of the Test: Drink plenty of water until 1 hour prior to the test. You may take your regular medications prior to the test.  Take metoprolol (Lopressor) two hours prior to test. FEMALES- please wear underwire-free bra if available, avoid dresses & tight clothing       After the Test: Drink plenty of water. After receiving IV contrast, you may experience a mild flushed feeling. This is normal. On occasion, you may experience a mild rash up to 24 hours after the test. This is not dangerous. If this occurs, you can take Benadryl 25 mg and increase your fluid intake. If you experience trouble breathing, this can be serious. If it is severe call 911 IMMEDIATELY. If it is mild, please call our office. If you take any of these medications: Glipizide/Metformin, Avandament, Glucavance, please do not take 48 hours after completing test unless otherwise instructed.  We will call to schedule your test 2-4 weeks out understanding that some insurance companies will need an authorization prior to the service being performed.   For non-scheduling related questions, please contact the cardiac imaging nurse navigator should you have any questions/concerns: Marchia Bond, Cardiac Imaging Nurse Navigator Gordy Clement, Cardiac Imaging Nurse Navigator [facility] Direct Office Dial: [phone]   For scheduling needs, including cancellations and rescheduling, please call [place], [phone].    Follow-Up: At [facility], you and your health needs are our  priority.  As part of our continuing mission to provide you with exceptional heart care, we have created designated Provider Care Teams.  These Care Teams include your primary Cardiologist (physician) and Advanced Practice Providers (APPs -  Physician Assistants and  Nurse Practitioners) who all work together to provide you with the care you need, when you need it.  We recommend signing up for the patient portal called "MyChart".  Sign up information is provided on this After Visit Summary.  MyChart is used to connect with patients for Virtual Visits (Telemedicine).  Patients are able to view lab/test results, encounter notes, upcoming appointments, etc.  Non-urgent messages can be sent to your provider as well.   To learn more about what you can do with MyChart, go to [email or web address].    Your next appointment:   6 month(s)  The format for your next appointment:  In Person  Provider:   Kathlyn Sacramento, MD     Other Instructions   Important Information About Sugar

## 2022-01-30 NOTE — Progress Notes (Signed)
Cardiology Office Note   Date:  01/30/2022   ID:  Davianna Deutschman, DOB Jan 23, 1951, MRN [id number]  PCP:  Dois Davenport, MD  Cardiologist:   Lorine Bears, MD   No chief complaint on file.      History of Present Illness: Christy Heath is a 71 y.o. female who is here today for follow-up visit regarding peripheral arterial disease.  She has known history of hyperlipidemia, essential hypertension, COPD and previous tobacco use.  She quit smoking in 2021 but she smoked 1 pack/day for more than 50 years.   She was seen in late 2021 for severe bilateral leg claudication worse on the left side.  Angiography was done in November of 2021 which showed small infrarenal abdominal aortic aneurysm. on the right side, there was mild iliac disease, borderline significant common femoral artery stenosis, severe proximal SFA stenosis with medium length occlusion in the distal SFA with reconstitution via collaterals in the proximal segment of the popliteal artery with three-vessel runoff below the knee.  On the left side, there was severe stenosis in the common iliac artery, severe proximal SFA disease, severe calcified proximal popliteal artery stenosis and three-vessel runoff below the knee.  I performed successful drug-coated balloon angioplasty to the left common iliac artery as well as orbital atherectomy and drug-coated balloon angioplasty to the left SFA and popliteal artery.   Most recent noninvasive vascular studies done in July of 2022 showed an ABI of 0.73 on the right and 0.88 on the left.  Duplex showed stable size abdominal aortic aneurysm at 3.3 cm with patent left iliac artery and patent left popliteal artery.  Most recent aortoiliac duplex in August showed stable size of abdominal aortic aneurysm at 3.3 cm with patent left iliac stent.  Most recent ABI was 0.72 on the right and 0.78 on the left.  Duplex showed moderate left common femoral artery stenosis and moderate left SFA disease. She  underwent cervical spine surgery in August.  She is healing slowly.  She reports stable bilateral calf claudication slightly worse on the left side.  It does not happen with regular activities or inside the house but it is more noticeable when she goes outside to walk. In patient, she noticed increased exertional dyspnea with occasional chest tightness.  Past Medical History:  Diagnosis Date   Hyperlipidemia    Hypertension    PAD (peripheral artery disease) (HCC)    Pre-diabetes        Current Outpatient Medications  Medication Sig Dispense Refill   celecoxib (CELEBREX) 200 MG capsule Take 200 mg by mouth daily.     clopidogrel (PLAVIX) 75 MG tablet Take 75 mg by mouth daily.     famotidine (PEPCID) 20 MG tablet Take 20 mg by mouth daily.     rosuvastatin (CRESTOR) 20 MG tablet Take 1 tablet (20 mg total) by mouth daily. 90 tablet 3   No current facility-administered medications for this visit.    Allergies:   Patient has no known allergies.    Social History:  The patient  reports that she quit smoking about 20 months ago. Her smoking use included cigarettes. She has never used smokeless tobacco. She reports current alcohol use. She reports that she does not currently use drugs after having used the following drugs: Marijuana.   Family History:  The patient's family history is negative for peripheral arterial disease or coronary artery disease.   ROS:  Please see the history of present illness.   Otherwise, review  of systems are positive for none.   All other systems are reviewed and negative.    PHYSICAL EXAM: VS:  BP 130/72   Pulse 68   Ht 5' 1.5" (1.562 m)   Wt 158 lb 6.4 oz (71.8 kg)   SpO2 93%   BMI 29.44 kg/m  , BMI Body mass index is 29.44 kg/m. GEN: Well nourished, well developed, in no acute distress  HEENT: normal  Neck: no JVD, carotid bruits, or masses Cardiac: RRR; no  rubs, or gallops,no edema .  1 /6 systolic murmur in the aortic area Respiratory:   clear to auscultation bilaterally, normal work of breathing GI: soft, nontender, nondistended, + BS MS: no deformity or atrophy  Skin: warm and dry, no rash Neuro:  Strength and sensation are intact Psych: euthymic mood, full affect    EKG:  EKG is  ordered today. EKG showed normal sinus rhythm with nonspecific ST changes.   Recent Labs: 04/04/2021: ALT 13 12/04/2021: BUN 11; Creatinine, Ser 0.74; Hemoglobin 12.9; Platelets 265; Potassium 4.4; Sodium 142    Lipid Panel    Component Value Date/Time   CHOL 135 12/27/2020 0939   TRIG 95 12/27/2020 0939   HDL 63 12/27/2020 0939   CHOLHDL 2.1 12/27/2020 0939   LDLCALC 54 12/27/2020 0939      Wt Readings from Last 3 Encounters:  01/30/22 158 lb 6.4 oz (71.8 kg)  12/07/21 160 lb (72.6 kg)  12/04/21 161 lb 14.4 oz (73.4 kg)            No data to display            ASSESSMENT AND PLAN:  1.  Peripheral arterial disease .  Status post revascularization of the left lower extremity.  She does have residual disease affecting the right lower extremity including moderate common femoral artery disease and occluded distal SFA.  She also has moderate left common femoral artery stenosis and left SFA disease.  She currently complains of moderate bilateral calf claudication.  This does not seem to be lifestyle limiting.  Recommend continuing medical therapy.    2.  Hyperlipidemia: New rosuvastatin.  I requested a follow-up lipid and liver profile.  3.  Previous tobacco use: No relapse.  4.  Essential hypertension: She is no longer on amlodipine but blood pressure is reasonable.  5.  Abdominal aortic aneurysm: 3.3 cm in diameter.  This was stable in size on most recent duplex.  6.  Exertional dyspnea and chest tightness: Symptoms are concerning for angina especially with her established history of atherosclerosis and peripheral arterial disease.  EKG is slightly abnormal with nonspecific ST changes.  Recommend evaluation with  cardiac CTA and an echocardiogram.    Disposition:   FU with me in 6 months  Signed,  Kathlyn Sacramento, MD  01/30/2022 8:21 AM    [place]

## 2022-02-13 ENCOUNTER — Ambulatory Visit (HOSPITAL_COMMUNITY): Payer: Medicare Other

## 2022-02-13 ENCOUNTER — Ambulatory Visit (HOSPITAL_COMMUNITY)
Admission: RE | Admit: 2022-02-13 | Discharge: 2022-02-13 | Disposition: A | Discharge status: Home / Self Care | Payer: Medicare Other | Source: Ambulatory Visit | Attending: Cardiovascular Disease | Admitting: Cardiovascular Disease

## 2022-02-13 DIAGNOSIS — Z87891 Personal history of nicotine dependence: Secondary | ICD-10-CM | POA: Insufficient documentation

## 2022-02-13 DIAGNOSIS — E785 Hyperlipidemia, unspecified: Secondary | ICD-10-CM | POA: Insufficient documentation

## 2022-02-13 DIAGNOSIS — I071 Rheumatic tricuspid insufficiency: Secondary | ICD-10-CM | POA: Insufficient documentation

## 2022-02-13 DIAGNOSIS — R079 Chest pain, unspecified: Secondary | ICD-10-CM | POA: Insufficient documentation

## 2022-02-13 DIAGNOSIS — I10 Essential (primary) hypertension: Secondary | ICD-10-CM | POA: Insufficient documentation

## 2022-02-13 DIAGNOSIS — R0602 Shortness of breath: Secondary | ICD-10-CM | POA: Diagnosis not present

## 2022-02-13 LAB — ECHOCARDIOGRAM COMPLETE
Area-P 1/2: 5.31 cm2
S' Lateral: 2.2 cm

## 2022-02-13 NOTE — Progress Notes (Signed)
  Echocardiogram 2D Echocardiogram has been performed.  Darlina Sicilian M 02/13/2022, 10:59 AM

## 2022-02-14 ENCOUNTER — Telehealth (HOSPITAL_COMMUNITY): Payer: Self-pay | Admitting: *Deleted

## 2022-02-14 NOTE — Telephone Encounter (Signed)
Attempted to call patient regarding upcoming cardiac CT appointment. °Left message on voicemail with name and callback number ° °Traeh Milroy RN Navigator Cardiac Imaging °[facility] °[phone] Office °[phone] Cell ° °

## 2022-02-15 ENCOUNTER — Ambulatory Visit (HOSPITAL_COMMUNITY)
Admission: RE | Admit: 2022-02-15 | Discharge: 2022-02-15 | Disposition: A | Discharge status: Home / Self Care | Payer: Medicare Other | Source: Ambulatory Visit | Attending: Internal Medicine | Admitting: Internal Medicine

## 2022-02-15 ENCOUNTER — Ambulatory Visit (HOSPITAL_COMMUNITY)
Admission: RE | Admit: 2022-02-15 | Discharge: 2022-02-15 | Disposition: A | Discharge status: Home / Self Care | Payer: Medicare Other | Source: Ambulatory Visit | Attending: Cardiovascular Disease | Admitting: Cardiovascular Disease

## 2022-02-15 ENCOUNTER — Ambulatory Visit (HOSPITAL_BASED_OUTPATIENT_CLINIC_OR_DEPARTMENT_OTHER)
Admission: RE | Admit: 2022-02-15 | Discharge: 2022-02-15 | Disposition: A | Discharge status: Home / Self Care | Payer: Medicare Other | Source: Ambulatory Visit | Attending: Internal Medicine | Admitting: Internal Medicine

## 2022-02-15 DIAGNOSIS — I2510 Atherosclerotic heart disease of native coronary artery without angina pectoris: Secondary | ICD-10-CM | POA: Diagnosis not present

## 2022-02-15 DIAGNOSIS — K449 Diaphragmatic hernia without obstruction or gangrene: Secondary | ICD-10-CM | POA: Insufficient documentation

## 2022-02-15 DIAGNOSIS — R079 Chest pain, unspecified: Secondary | ICD-10-CM | POA: Diagnosis present

## 2022-02-15 DIAGNOSIS — I700 Atherosclerosis of aorta: Secondary | ICD-10-CM | POA: Insufficient documentation

## 2022-02-15 DIAGNOSIS — R072 Precordial pain: Secondary | ICD-10-CM | POA: Diagnosis present

## 2022-02-15 DIAGNOSIS — R931 Abnormal findings on diagnostic imaging of heart and coronary circulation: Secondary | ICD-10-CM | POA: Diagnosis not present

## 2022-02-15 DIAGNOSIS — J984 Other disorders of lung: Secondary | ICD-10-CM | POA: Insufficient documentation

## 2022-02-15 MED ORDER — IOHEXOL 350 MG/ML SOLN
100.0000 mL | Freq: Once | INTRAVENOUS | Status: AC | PRN
  Administered 2022-02-15: 100 mL via INTRAVENOUS

## 2022-02-15 MED ORDER — NITROGLYCERIN 0.4 MG SL SUBL
0.8000 mg | SUBLINGUAL_TABLET | Freq: Once | SUBLINGUAL | Status: AC
  Administered 2022-02-15: 0.8 mg via SUBLINGUAL

## 2022-02-15 MED ORDER — NITROGLYCERIN 0.4 MG SL SUBL
SUBLINGUAL_TABLET | SUBLINGUAL | Status: AC
  Filled 2022-02-15: qty 2

## 2022-02-23 ENCOUNTER — Encounter: Payer: Self-pay | Admitting: *Deleted

## 2022-04-24 ENCOUNTER — Other Ambulatory Visit: Payer: Self-pay | Admitting: Cardiovascular Disease

## 2022-04-24 NOTE — Telephone Encounter (Signed)
Refill request

## 2022-10-25 IMAGING — DX DG CHEST 1V PORT
1 series · 1 of 1 positions shown · non-contrast
Comparison: None.

CLINICAL DATA: COVID positive.

EXAM:
PORTABLE CHEST 1 VIEW

[chest ap]
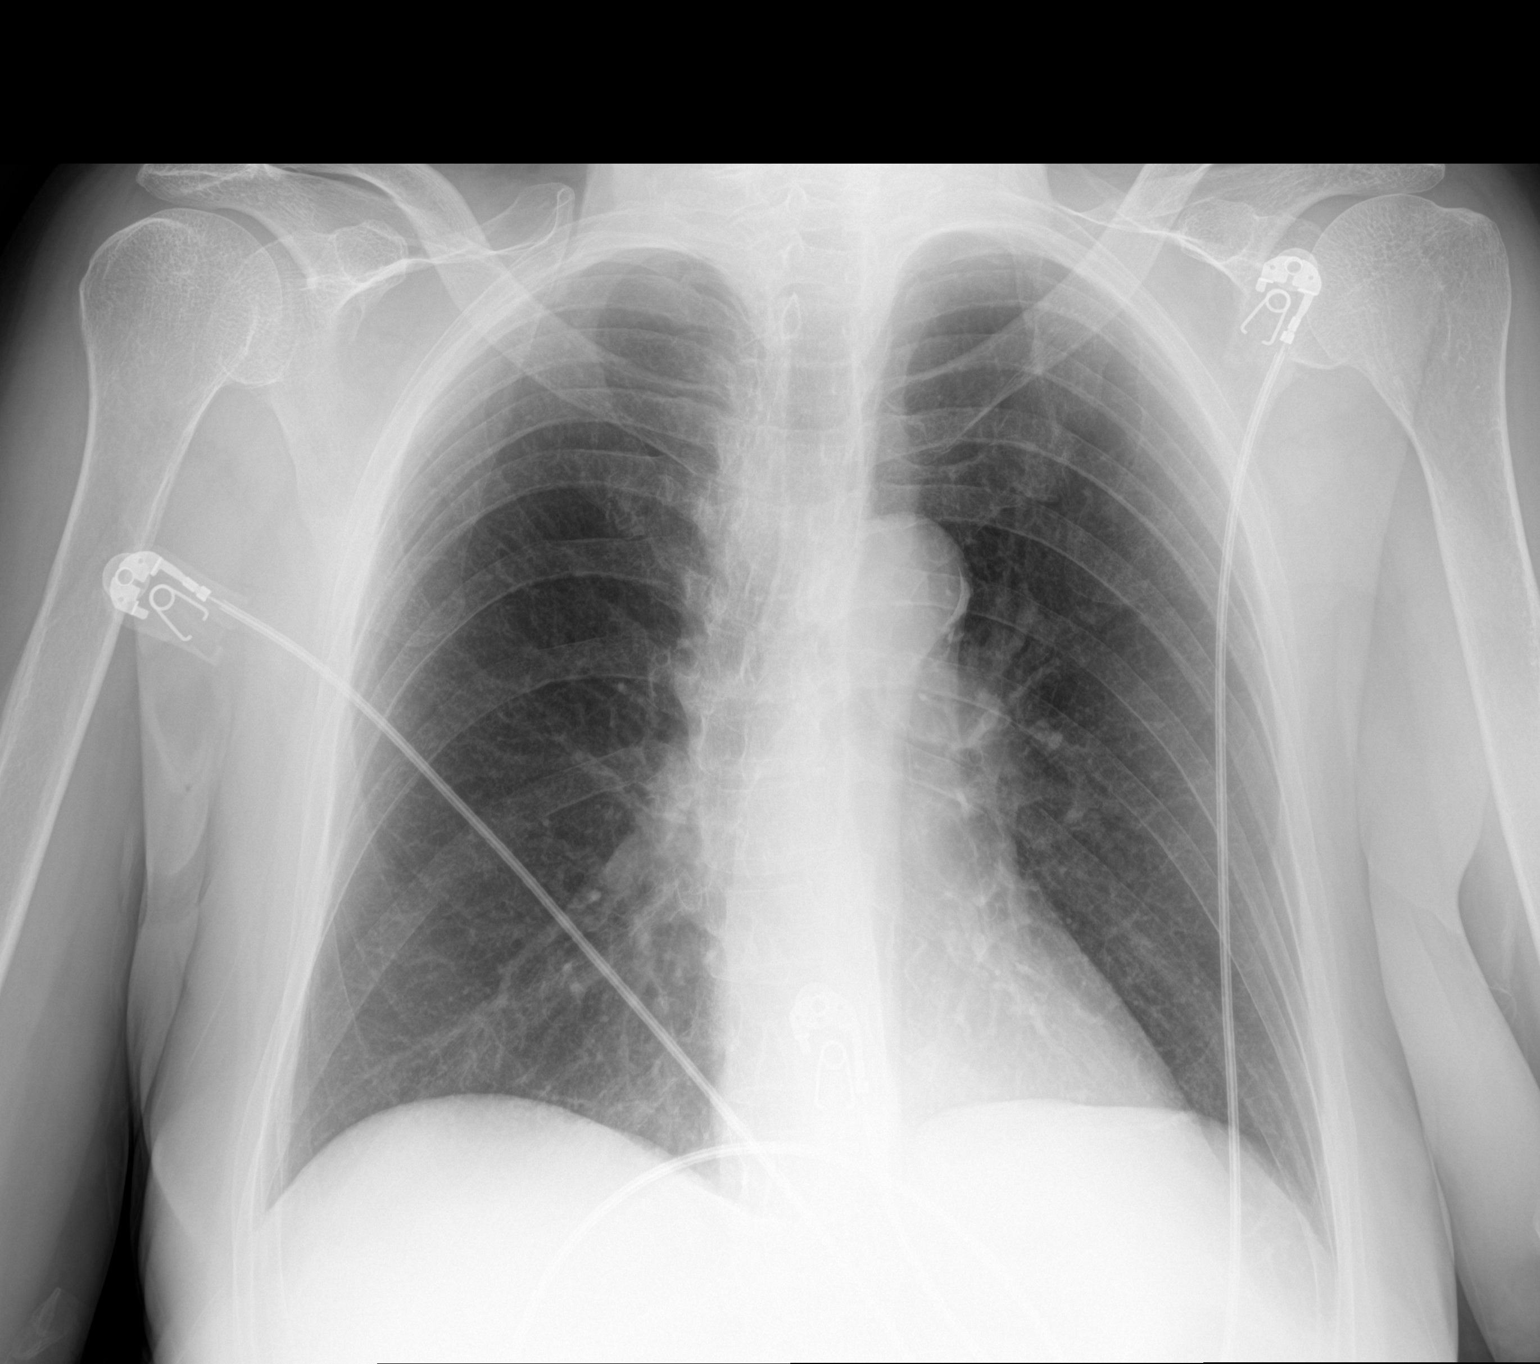

[1 of 1 positions shown; findings below may reference images not displayed]

FINDINGS: The heart size and mediastinal contours are within normal limits.
Both lungs are clear. The visualized skeletal structures are
unremarkable.
IMPRESSION: No active disease.

## 2022-10-30 ENCOUNTER — Ambulatory Visit (HOSPITAL_BASED_OUTPATIENT_CLINIC_OR_DEPARTMENT_OTHER)
Admission: RE | Admit: 2022-10-30 | Discharge: 2022-10-30 | Disposition: A | Discharge status: Home / Self Care | Payer: Medicare HMO | Source: Ambulatory Visit | Attending: Cardiovascular Disease | Admitting: Cardiovascular Disease

## 2022-10-30 ENCOUNTER — Ambulatory Visit (HOSPITAL_BASED_OUTPATIENT_CLINIC_OR_DEPARTMENT_OTHER)
Admission: RE | Admit: 2022-10-30 | Discharge: 2022-10-30 | Discharge status: Home / Self Care | Payer: Medicare HMO | Source: Ambulatory Visit | Attending: Cardiovascular Disease | Admitting: Cardiovascular Disease

## 2022-10-30 ENCOUNTER — Ambulatory Visit (HOSPITAL_COMMUNITY)
Admission: RE | Admit: 2022-10-30 | Discharge: 2022-10-30 | Disposition: A | Discharge status: Home / Self Care | Payer: Medicare HMO | Source: Ambulatory Visit | Attending: Cardiology | Admitting: Cardiology

## 2022-10-30 DIAGNOSIS — I739 Peripheral vascular disease, unspecified: Secondary | ICD-10-CM

## 2022-10-30 DIAGNOSIS — I714 Abdominal aortic aneurysm, without rupture, unspecified: Secondary | ICD-10-CM | POA: Diagnosis present

## 2022-10-30 DIAGNOSIS — I708 Atherosclerosis of other arteries: Secondary | ICD-10-CM | POA: Diagnosis present

## 2022-10-30 DIAGNOSIS — I7143 Infrarenal abdominal aortic aneurysm, without rupture: Secondary | ICD-10-CM | POA: Insufficient documentation

## 2022-11-01 LAB — VAS US ABI WITH/WO TBI
Left ABI: 0.87
Right ABI: 0.72

## 2022-11-02 ENCOUNTER — Other Ambulatory Visit: Payer: Self-pay | Admitting: *Deleted

## 2022-11-02 DIAGNOSIS — I739 Peripheral vascular disease, unspecified: Secondary | ICD-10-CM

## 2022-11-02 DIAGNOSIS — I714 Abdominal aortic aneurysm, without rupture, unspecified: Secondary | ICD-10-CM

## 2023-04-03 IMAGING — DX DG CHEST 1V
1 series · 1 of 1 positions shown · non-contrast
Comparison: Chest x-ray 04/03/2021.

CLINICAL DATA: Flu-like symptoms.

EXAM:
CHEST  1 VIEW

[chest ap]
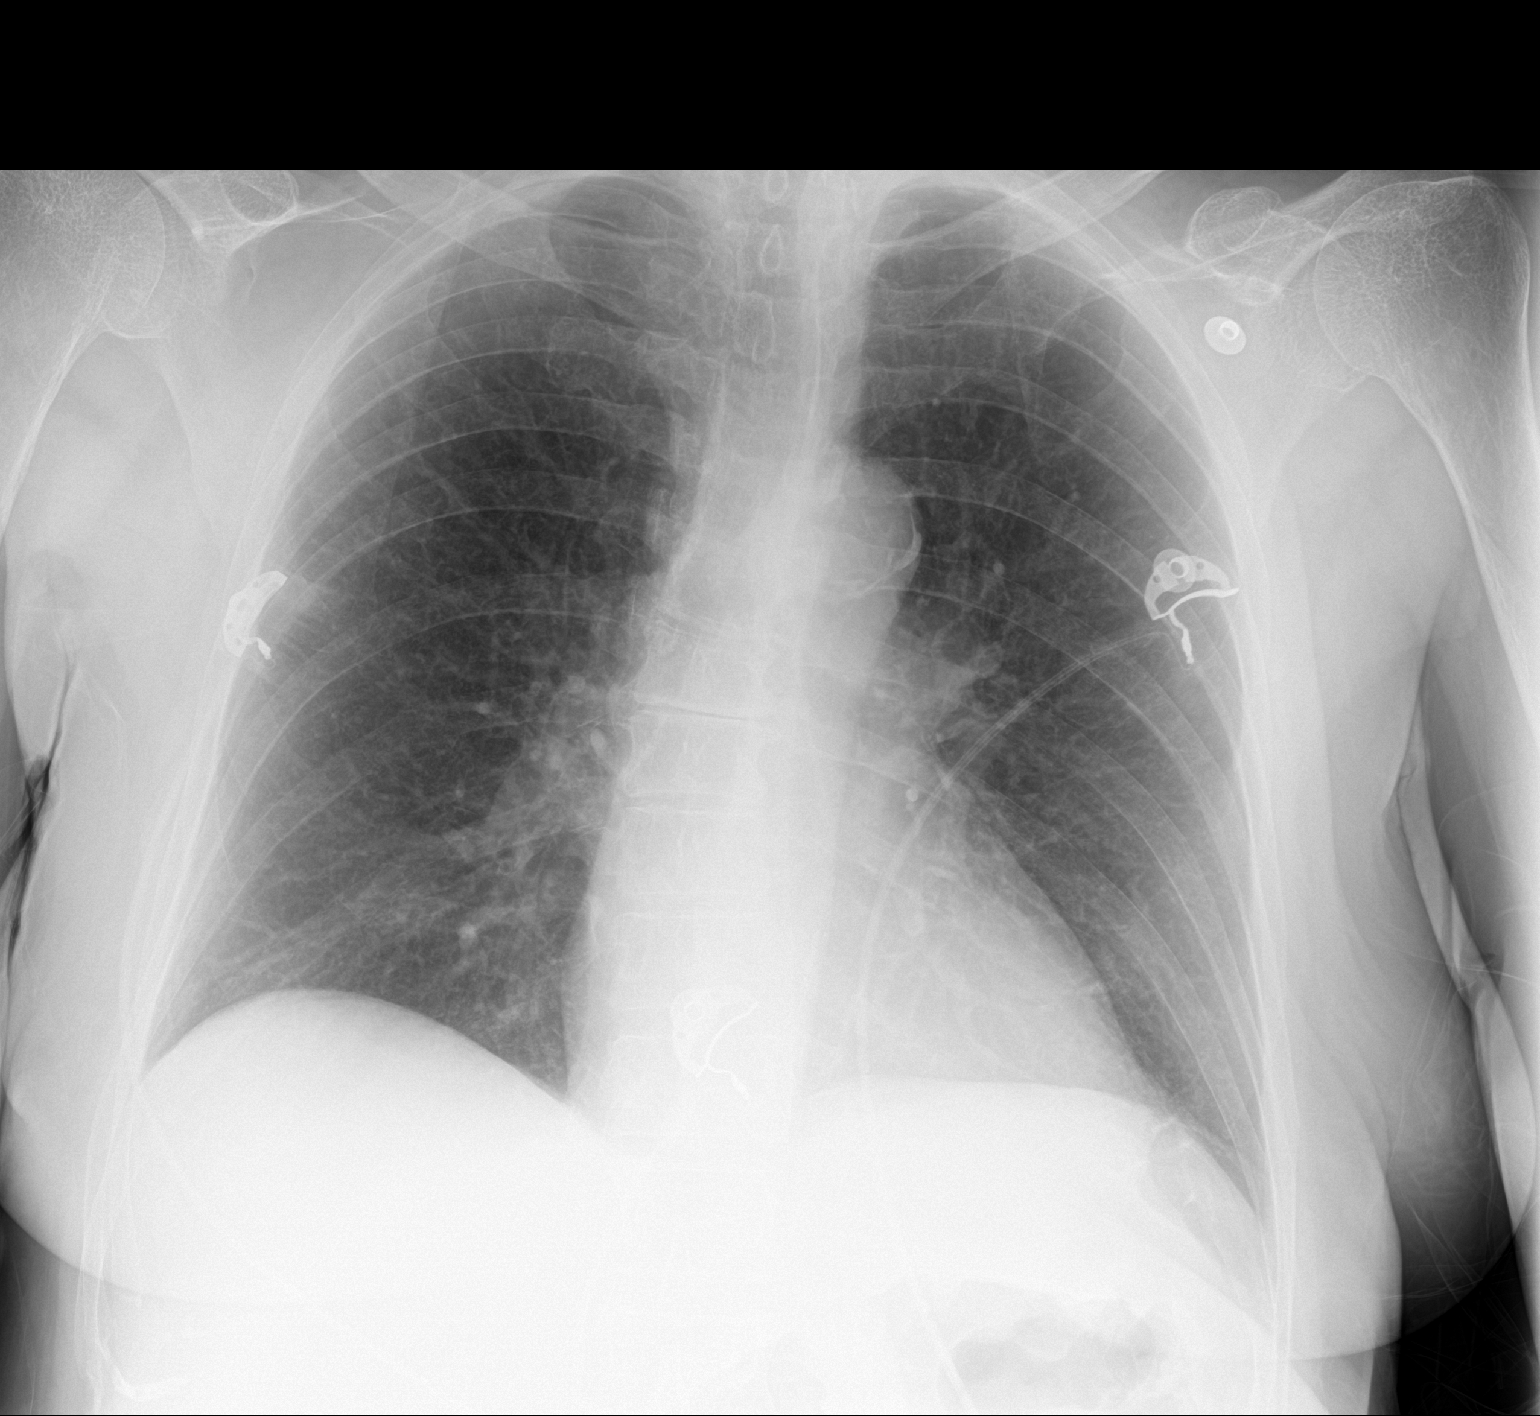

[1 of 1 positions shown; findings below may reference images not displayed]

FINDINGS: The heart size and mediastinal contours are within normal limits.
Both lungs are clear. The visualized skeletal structures are
unremarkable.
IMPRESSION: No active disease.

## 2023-09-09 ENCOUNTER — Encounter: Payer: Self-pay | Admitting: Cardiovascular Disease

## 2023-10-24 ENCOUNTER — Other Ambulatory Visit: Payer: Self-pay

## 2023-10-30 ENCOUNTER — Ambulatory Visit (HOSPITAL_COMMUNITY)

## 2023-10-30 ENCOUNTER — Ambulatory Visit (HOSPITAL_COMMUNITY): Admission: RE | Admit: 2023-10-30 | Source: Ambulatory Visit
